# Patient Record
Sex: Male | Born: 1965 | Race: Black or African American | Hispanic: No | Smoking: Current every day smoker
Health system: Southern US, Community
[De-identification: ages and names within clinical notes are randomized; demographics above are authoritative.]

## PROBLEM LIST (undated history)

## (undated) DIAGNOSIS — Z789 Other specified health status: Secondary | ICD-10-CM

## (undated) HISTORY — PX: ROTATOR CUFF REPAIR: SHX139

---

## 2011-03-17 ENCOUNTER — Emergency Department: Payer: Self-pay | Admitting: Emergency Medicine

## 2011-03-19 ENCOUNTER — Emergency Department: Payer: Self-pay | Admitting: Emergency Medicine

## 2015-08-05 ENCOUNTER — Emergency Department
Admission: EM | Admit: 2015-08-05 | Discharge: 2015-08-05 | Disposition: A | Discharge status: Home / Self Care | Payer: Self-pay | Attending: Emergency Medicine | Admitting: Emergency Medicine

## 2015-08-05 DIAGNOSIS — Z720 Tobacco use: Secondary | ICD-10-CM | POA: Insufficient documentation

## 2015-08-05 DIAGNOSIS — K047 Periapical abscess without sinus: Secondary | ICD-10-CM | POA: Insufficient documentation

## 2015-08-05 HISTORY — DX: Other specified health status: Z78.9

## 2015-08-05 MED ORDER — CEFTRIAXONE SODIUM 1 G IJ SOLR
500.0000 mg | Freq: Once | INTRAMUSCULAR | Status: AC
  Administered 2015-08-05: 500 mg via INTRAMUSCULAR
  Filled 2015-08-05: qty 10

## 2015-08-05 MED ORDER — OXYCODONE-ACETAMINOPHEN 5-325 MG PO TABS
2.0000 | ORAL_TABLET | Freq: Once | ORAL | Status: AC
  Administered 2015-08-05: 2 via ORAL
  Filled 2015-08-05: qty 2

## 2015-08-05 MED ORDER — IBUPROFEN 800 MG PO TABS: 800.0000 mg | ORAL_TABLET | Freq: Three times a day (TID) | ORAL | Status: DC | PRN

## 2015-08-05 MED ORDER — AMOXICILLIN 500 MG PO TABS: 500.0000 mg | ORAL_TABLET | Freq: Two times a day (BID) | ORAL | Status: DC

## 2015-08-05 MED ORDER — OXYCODONE-ACETAMINOPHEN 5-325 MG PO TABS: 1.0000 | ORAL_TABLET | ORAL | Status: DC | PRN

## 2015-08-05 NOTE — ED Notes (Signed)
Pt c/o right sided upper mouth swelling starting yesterday and worsening this am. Painful to touch. Pain 10/10.

## 2015-08-05 NOTE — ED Provider Notes (Signed)
[facility] Emergency Department Provider Note  ____________________________________________  Time seen: Approximately 10:21 AM  I have reviewed the triage vital signs and the nursing notes.   HISTORY  Chief Complaint Facial Swelling    HPI Dustin Meyers is a 49 y.o. male resents for evaluation of right upper lip pain and jaw swelling. Sudden onset yesterday with the pain radiating of 10 over 10.   Past Medical History  Diagnosis Date  . Patient denies medical problems     There are no active problems to display for this patient.   History reviewed. No pertinent past surgical history.  Current Outpatient Rx  Name  Route  Sig  Dispense  Refill  . amoxicillin (AMOXIL) 500 MG tablet   Oral   Take 1 tablet (500 mg total) by mouth 2 (two) times daily.   20 tablet   0   . ibuprofen (ADVIL,MOTRIN) 800 MG tablet   Oral   Take 1 tablet (800 mg total) by mouth every 8 (eight) hours as needed.   30 tablet   0   . oxyCODONE-acetaminophen (ROXICET) 5-325 MG tablet   Oral   Take 1-2 tablets by mouth every 4 (four) hours as needed for severe pain.   15 tablet   0     Allergies Review of patient's allergies indicates no known allergies.  History reviewed. No pertinent family history.  Social History Social History  Substance Use Topics  . Smoking status: Current Every Day Smoker  . Smokeless tobacco: None  . Alcohol Use: Yes    Review of Systems Constitutional: No fever/chills Eyes: No visual changes. ENT: Positive right upper lip edema-pain Cardiovascular: Denies chest pain. Respiratory: Denies shortness of breath. Gastrointestinal: No abdominal pain.  No nausea, no vomiting.  No diarrhea.  No constipation. Genitourinary: Negative for dysuria. Musculoskeletal: Negative for back pain. Skin: Negative for rash. Neurological: Negative for headaches, focal weakness or numbness.  10-point ROS otherwise  negative.  ____________________________________________   PHYSICAL EXAM:  VITAL SIGNS: ED Triage Vitals  Enc Vitals Group     BP 08/05/15 0933 131/77 mmHg     Pulse Rate 08/05/15 0933 104     Resp 08/05/15 0933 18     Temp 08/05/15 0933 98.6 F (37 C)     Temp Source 08/05/15 0933 Oral     SpO2 08/05/15 0933 96 %     Weight 08/05/15 0933 190 lb (86.183 kg)     Height 08/05/15 0933  (1.854 m)     Head Cir --      Peak Flow --      Pain Score 08/05/15 0933 10     Pain Loc --      Pain Edu? --      Excl. in GC? --     Constitutional: Alert and oriented. Well appearing and in no acute distress. Eyes: Conjunctivae are normal. PERRL. EOMI. Head: Atraumatic. Nose: No congestion/rhinnorhea. Mouth/Throat: Mucous membranes are moist.  Oropharynx non-erythematous. Teeth with multiple dental caries. Right upper incisor fracture with erythema and tenderness to the gum Neck: No stridor.   Cardiovascular: Normal rate, regular rhythm. Grossly normal heart sounds.  Good peripheral circulation. Respiratory: Normal respiratory effort.  No retractions. Lungs CTAB. Gastrointestinal: Soft and nontender. No distention. No abdominal bruits. No CVA tenderness. Musculoskeletal: No lower extremity tenderness nor edema.  No joint effusions. Neurologic:  Normal speech and language. No gross focal neurologic deficits are appreciated. No gait instability. Skin:  Skin is warm,  dry and intact. No rash noted. Psychiatric: Mood and affect are normal. Speech and behavior are normal.  ____________________________________________   LABS (all labs ordered are listed, but only abnormal results are displayed)  Labs Reviewed - No data to display ____________________________________________   PROCEDURES  Procedure(s) performed: None  Critical Care performed: No  ____________________________________________   INITIAL IMPRESSION / ASSESSMENT AND PLAN / ED COURSE  Pertinent labs & imaging results  that were available during my care of the patient were reviewed by me and considered in my medical decision making (see chart for details).  Dental abscess. Rx given for amoxicillin 500 mg 3 times a day #30, Motrin 800 mg 3 times a day #30, Percocet 5/325 #10. Follow up with dentist, list provided. She voices no other emergency medical complaints at this time. ____________________________________________   FINAL CLINICAL IMPRESSION(S) / ED DIAGNOSES  Final diagnoses:  Dental abscess      Dustin DakinCharles M Roxanne Panek, PA-C 08/05/15 1039  Dustin MoccasinBrian S Quigley, MD 08/05/15 [phone]

## 2015-08-05 NOTE — Discharge Instructions (Signed)
OPTIONS FOR DENTAL FOLLOW UP CARE ° °[place] [facility] [employer] and [facility] Services - Local Safety Net Dental Clinics °http://www.ncdhhs.gov/dph/oralhealth/services/safetynetclinics.htm °  °[facility] (336-562-3123) ° °[facility] [place] (919-933-9087) ° °[facility] (919-663-1744 ext [phone]) ° °[facility] ([phone]) ° °[facility] ([phone]) °This clinic caters to the indigent population and is on a lottery system. °Location: °[facility], [address], [place] °Clinic Hours: °Wednesdays from 6pm - 9pm, patients seen by a lottery system. °For dates, call or go to [email or web address]/[facility]/patients/[email or web address] °Services: °Cleanings, fillings and simple extractions. °Payment Options: °DENTAL WORK IS FREE OF CHARGE. Bring proof of income or support. °Best way to get seen: °Arrive at 5:15 pm - this is a lottery, NOT first come/first serve, so arriving earlier will not increase your chances of being seen. °  °  °[facility] °[phone] °Select option 1 for emergencies °  °Location: °[facility], [address], [place] °Clinic Hours: °No walk-ins accepted - call the day before to schedule an appointment. °Check in times are 9:30 am and 1:30 pm. °Services: °Simple extractions, temporary fillings, pulpectomy/pulp debridement, uncomplicated abscess drainage. °Payment Options: °PAYMENT IS DUE AT THE TIME OF SERVICE.  Fee is usually $100-200, additional surgical procedures (e.g. abscess drainage) may be extra. °Cash, checks, Visa/MasterCard accepted.  Can file Medicaid if patient is covered for dental - patient should call case worker to check. °No discount for [facility] patients. °Best way to get seen: °MUST call the day before and get onto the schedule. Can usually be seen the next 1-2 days. No walk-ins accepted. °  °  °[facility] °[phone] °   °Location: °[facility], [address], [place] °Clinic Hours: °M, W, Th, F 8am or 1:30pm, Tues 9a or 1:30 - first come/first served. °Services: °Simple extractions, temporary fillings, uncomplicated abscess drainage.  You do not need to be an [place] resident. °Payment Options: °PAYMENT IS DUE AT THE TIME OF SERVICE. °Dental insurance, otherwise sliding scale - bring proof of income or support. °Depending on income and treatment needed, cost is usually $50-200. °Best way to get seen: °Arrive early as it is first come/first served. °  °  °[facility] °[phone] °  °Location: °[address] °Clinic Hours: °Mon-Thu 8a-5p °Services: °Most basic dental services including extractions and fillings. °Payment Options: °PAYMENT IS DUE AT THE TIME OF SERVICE. °Sliding scale, up to 50% off - bring proof if income or support. °Medicaid with dental option accepted. °Best way to get seen: °Call to schedule an appointment, can usually be seen within 2 weeks OR they will try to see walk-ins - show up at 8a or 2p (you may have to wait). °  °  °[facility] °[phone] °[place] RESIDENTS ONLY °  °Location: °[facility], [address], [place], [place] [address] °Clinic Hours: By appointment only. °Monday - Thursday 8am-5pm, Friday 8am-12pm °Services: Cleanings, fillings, extractions. °Payment Options: °PAYMENT IS DUE AT THE TIME OF SERVICE. °Cash, Visa or MasterCard. Sliding scale - $30 minimum per service. °Best way to get seen: °Come in to office, complete packet and make an appointment - need proof of income °or support monies for each household member and proof of [place] residence. °Usually takes about a month to get in. °  °  °[facility] °[phone] °  °Location: °[address]   [place] °Clinic Hours: Walk-in Urgent Care Dental Services are offered Monday-Friday  mornings only. °The numbers of emergencies accepted daily is limited to the number of °providers available. °Maximum 15 - Mondays, Wednesdays & Thursdays °Maximum 10 - Tuesdays & Fridays °Services: °You do not need to be a  [place] resident to be seen for a dental emergency. °Emergencies are defined as pain, swelling, abnormal bleeding, or dental trauma. Walkins will receive x-rays if needed. °NOTE: Dental cleaning is not an emergency. °Payment Options: °PAYMENT IS DUE AT THE TIME OF SERVICE. °Minimum co-pay is $40.00 for uninsured patients. °Minimum co-pay is $3.00 for Medicaid with dental coverage. °Dental Insurance is accepted and must be presented at time of visit. °Medicare does not cover dental. °Forms of payment: Cash, credit card, checks. °Best way to get seen: °If not previously registered with the clinic, walk-in dental registration begins at 7:15 am and is on a first come/first serve basis. °If previously registered with the clinic, call to make an appointment. °  °  °[facility] °[phone] °[place] RESIDENTS ONLY °  °Location: °[address], [place], [place] °Clinic Hours: °Mon-Thu 10a-2p °Services: Extractions only! °Payment Options: °FREE (donations accepted) - bring proof of income or support °Best way to get seen: °Call and schedule an appointment OR come at 8am on the 1st Monday of every month (except for holidays) when it is first come/first served. °  °  °[facility] °[phone] °  °Location: °[address], [place] °Clinic Hours: °Friday mornings °Services, Payment Options, Best way to get seen: °Call for info ° ° °Dental Abscess °A dental abscess is a collection of pus in or around a tooth. °CAUSES °This condition is caused by a bacterial infection around the root of the tooth that involves the inner part of the tooth (pulp). It may result from: °· Severe tooth decay. °· Trauma to the tooth that allows bacteria to enter into the pulp, such as a broken or chipped  tooth. °· Severe gum disease around a tooth. °SYMPTOMS °Symptoms of this condition include: °· Severe pain in and around the infected tooth. °· Swelling and redness around the infected tooth, in the mouth, or in the face. °· Tenderness. °· Pus drainage. °· Bad breath. °· Bitter taste in the mouth. °· Difficulty swallowing. °· Difficulty opening the mouth. °· Nausea. °· Vomiting. °· Chills. °· Swollen neck glands. °· Fever. °DIAGNOSIS °This condition is diagnosed with examination of the infected tooth. During the exam, your dentist may tap on the infected tooth. Your dentist will also ask about your medical and dental history and may order X-rays. °TREATMENT °This condition is treated by eliminating the infection. This may be done with: °· Antibiotic medicine. °· A root canal. This may be performed to save the tooth. °· Pulling (extracting) the tooth. This may also involve draining the abscess. This is done if the tooth cannot be saved. °HOME CARE INSTRUCTIONS °· Take medicines only as directed by your dentist. °· If you were prescribed antibiotic medicine, finish all of it even if you start to feel better. °· Rinse your mouth (gargle) often with salt water to relieve pain or swelling. °· Do not drive or operate heavy machinery while taking pain medicine. °· Do not apply heat to the outside of your mouth. °· Keep all follow-up visits as directed by your dentist. This is important. °SEEK MEDICAL CARE IF: °· Your pain is worse and is not helped by medicine. °SEEK IMMEDIATE MEDICAL CARE IF: °·   You have a fever or chills. °· Your symptoms suddenly get worse. °· You have a very bad headache. °· You have problems breathing or swallowing. °· You have trouble opening your mouth. °· You have swelling in your neck or around your eye. °  °This information is not intended to replace advice given to you by your health care provider. Make sure you discuss any questions you have with your health care provider. °  °Document  Released: 09/29/2005 Document Revised: 02/13/2015 Document Reviewed: 09/26/2014 °Elsevier Interactive Patient Education ©2016 Elsevier Inc. ° °

## 2015-10-22 ENCOUNTER — Encounter: Payer: Self-pay | Admitting: Emergency Medicine

## 2015-10-22 ENCOUNTER — Emergency Department
Admission: EM | Admit: 2015-10-22 | Discharge: 2015-10-22 | Disposition: A | Discharge status: Home / Self Care | Payer: No Typology Code available for payment source | Attending: Emergency Medicine | Admitting: Emergency Medicine

## 2015-10-22 ENCOUNTER — Emergency Department: Payer: No Typology Code available for payment source

## 2015-10-22 DIAGNOSIS — S7002XA Contusion of left hip, initial encounter: Secondary | ICD-10-CM | POA: Insufficient documentation

## 2015-10-22 DIAGNOSIS — Z792 Long term (current) use of antibiotics: Secondary | ICD-10-CM | POA: Diagnosis not present

## 2015-10-22 DIAGNOSIS — S4992XA Unspecified injury of left shoulder and upper arm, initial encounter: Secondary | ICD-10-CM | POA: Diagnosis not present

## 2015-10-22 DIAGNOSIS — Y9389 Activity, other specified: Secondary | ICD-10-CM | POA: Diagnosis not present

## 2015-10-22 DIAGNOSIS — M62838 Other muscle spasm: Secondary | ICD-10-CM

## 2015-10-22 DIAGNOSIS — S7012XA Contusion of left thigh, initial encounter: Secondary | ICD-10-CM | POA: Insufficient documentation

## 2015-10-22 DIAGNOSIS — M62830 Muscle spasm of back: Secondary | ICD-10-CM | POA: Insufficient documentation

## 2015-10-22 DIAGNOSIS — S0990XA Unspecified injury of head, initial encounter: Secondary | ICD-10-CM | POA: Insufficient documentation

## 2015-10-22 DIAGNOSIS — Y92410 Unspecified street and highway as the place of occurrence of the external cause: Secondary | ICD-10-CM | POA: Insufficient documentation

## 2015-10-22 DIAGNOSIS — F17200 Nicotine dependence, unspecified, uncomplicated: Secondary | ICD-10-CM | POA: Diagnosis not present

## 2015-10-22 DIAGNOSIS — Y998 Other external cause status: Secondary | ICD-10-CM | POA: Diagnosis not present

## 2015-10-22 DIAGNOSIS — S199XXA Unspecified injury of neck, initial encounter: Secondary | ICD-10-CM | POA: Diagnosis present

## 2015-10-22 MED ORDER — CYCLOBENZAPRINE HCL 10 MG PO TABS: 10.0000 mg | ORAL_TABLET | Freq: Three times a day (TID) | ORAL | Status: DC | PRN

## 2015-10-22 MED ORDER — MELOXICAM 15 MG PO TABS
15.0000 mg | ORAL_TABLET | Freq: Every day | ORAL | Status: DC
Start: 2015-10-22 — End: 2017-09-14
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
## 2015-10-22 NOTE — Discharge Instructions (Signed)
Contusion °A contusion is a deep bruise. Contusions are the result of a blunt injury to tissues and muscle fibers under the skin. The injury causes bleeding under the skin. The skin overlying the contusion may turn blue, purple, or yellow. Minor injuries will give you a painless contusion, but more severe contusions may stay painful and swollen for a few weeks.  °CAUSES  °This condition is usually caused by a blow, trauma, or direct force to an area of the body. °SYMPTOMS  °Symptoms of this condition include: °· Swelling of the injured area. °· Pain and tenderness in the injured area. °· Discoloration. The area may have redness and then turn blue, purple, or yellow. °DIAGNOSIS  °This condition is diagnosed based on a physical exam and medical history. An X-ray, CT scan, or MRI may be needed to determine if there are any associated injuries, such as broken bones (fractures). °TREATMENT  °Specific treatment for this condition depends on what area of the body was injured. In general, the best treatment for a contusion is resting, icing, applying pressure to (compression), and elevating the injured area. This is often called the RICE strategy. Over-the-counter anti-inflammatory medicines may also be recommended for pain control.  °HOME CARE INSTRUCTIONS  °· Rest the injured area. °· If directed, apply ice to the injured area: °¨ Put ice in a plastic bag. °¨ Place a towel between your skin and the bag. °¨ Leave the ice on for 20 minutes, 2-3 times per day. °· If directed, apply light compression to the injured area using an elastic bandage. Make sure the bandage is not wrapped too tightly. Remove and reapply the bandage as directed by your health care provider. °· If possible, raise (elevate) the injured area above the level of your heart while you are sitting or lying down. °· Take over-the-counter and prescription medicines only as told by your health care provider. °SEEK MEDICAL CARE IF: °· Your symptoms do not  improve after several days of treatment. °· Your symptoms get worse. °· You have difficulty moving the injured area. °SEEK IMMEDIATE MEDICAL CARE IF:  °· You have severe pain. °· You have numbness in a hand or foot. °· Your hand or foot turns pale or cold. °  °This information is not intended to replace advice given to you by your health care provider. Make sure you discuss any questions you have with your health care provider. °  °Document Released: 07/09/2005 Document Revised: 06/20/2015 Document Reviewed: 02/14/2015 °Elsevier Interactive Patient Education ©2016 Elsevier Inc. ° °Periosteal Hematoma °Periosteal hematoma (bone bruise) is a localized, tender, raised area close to the bone. It can occur from a small hidden fracture of the bone, following surgery, or from other trauma to the area. It typically occurs in bones located close to the surface of the skin, such as the shin, knee, and heel bone. Although it may take 2 or more weeks to completely heal, bone bruises typically are not associated with permanent or serious damage to the bone. If you are taking blood thinners, you may be at greater risk for such injuries.  °CAUSES  °A bone bruise is usually caused by high-impact trauma to the bone, but it can be caused by sports injuries or twisting injuries. °SIGNS AND SYMPTOMS  °· Severe pain around the injured area that typically lasts longer than a normal bruise. °· Difficulty using the bruised area. °· Tender, raised area close to the bone. °· Discoloration or swelling of the bruised area. °DIAGNOSIS  °You   may need an MRI of the injured area to confirm a bone bruise if your health care provider feels it is necessary. A regular X-ray will not detect a bone bruise, but it will detect a broken bone (fracture). An X-ray may be taken to rule out any fractures. °TREATMENT  °Often, the best treatment for a bone bruise is resting, icing, and applying cold compresses to the injured area. Over-the-counter medicines may  also be recommended for pain control. °HOME CARE INSTRUCTIONS  °Some things you can do to improve the condition are:  °· Rest and elevate the area of injury as long as it is very tender or swollen. °· Apply ice to the injured area: °¨ Put ice in a plastic bag. °¨ Place a towel between your skin and the bag. °¨ Leave the ice on for 20 minutes, 2-3 times a day. °· Use an elastic wrap to reduce swelling and protect the injured area. Make sure it is not applied too tightly. If the area around the wrap becomes cold or blue, the wrap is too tight. Wrap it more loosely. °· For activity: °¨ Follow your health care provider's instructions about whether walking with crutches is required. This will depend on how serious your condition is. °¨ Start weight bearing gradually on the bruised part. °¨ Continue to use crutches or a cane until you can stand without causing pain, or as instructed. °· If a plaster splint was applied: °¨ Wear the splint until you are seen for a follow-up exam. °¨ Rest it on nothing harder than a pillow the first 24 hours. °¨ Do not put weight on it. °¨ Do not get it wet. You may take it off to take a shower or bath. °· You may have been given an elastic bandage to use with or without the plaster splint. The splint is too tight if you have numbness or tingling, or if the skin around the bandage becomes cold and blue. Adjust the bandage to make it comfortable. °· If an air splint was applied: °¨ You may alter the amount of air in the splint as needed for comfort. °¨ You may take it off at night and to take a shower or bath. °· If the injury was in either leg, wiggle your toes in the splint several times per day if you are able. °· Only take over-the-counter or prescription medicines for pain, discomfort, or fever as directed by your health care provider. °· Keep all follow-up visits with your health care provider. This includes any orthopedic referrals, physical therapy, and rehabilitation. Any delay in  getting necessary care could result in a delay or failure of the bones to heal. °SEEK MEDICAL CARE IF:  °· You have an increase in bruising, swelling, tenderness, heat, or pain over your injury. °· You notice coldness of your toes that does not improve after removing a splint or bandage. °· Your pain is not lessened after you take medicine. °· You have increased difficulty bearing weight on the injured leg, if the injury is in either leg. °SEEK IMMEDIATE MEDICAL CARE IF:  °· You have severe pain near the injured area or severe pain with stretching. °· You have increased swelling that resulted in a tense, hard area or a loss of sensation in the area of the injury. °· You have pale, cool skin below the area of the injury (in an extremity) that does not go away after removing a splint or bandage. °MAKE SURE YOU:  °· Understand these   instructions.  Will watch your condition.  Will get help right away if you are not doing well or get worse.   This information is not intended to replace advice given to you by your health care provider. Make sure you discuss any questions you have with your health care provider.   Muscle Cramps and Spasms    Muscle cramps and spasms occur when a muscle or muscles tighten and you have no control over this tightening (involuntary muscle contraction). They are a common problem and can develop in any muscle. The most common place is in the calf muscles of the leg. Both muscle cramps and muscle spasms are involuntary muscle contractions, but they also have differences:  Muscle cramps are sporadic and painful. They may last a few seconds to a quarter of an hour. Muscle cramps are often more forceful and last longer than muscle spasms.  Muscle spasms may or may not be painful. They may also last just a few seconds or much longer. CAUSES  It is uncommon for cramps or spasms to be due to a serious underlying problem. In many cases, the cause of cramps or spasms is unknown. Some  common causes are:  Overexertion.  Overuse from repetitive motions (doing the same thing over and over).  Remaining in a certain position for a long period of time.  Improper preparation, form, or technique while performing a sport or activity.  Dehydration.  Injury.  Side effects of some medicines.  Abnormally low levels of the salts and ions in your blood (electrolytes), especially potassium and calcium. This could happen if you are taking water pills (diuretics) or you are pregnant.  Some underlying medical problems can make it more likely to develop cramps or spasms. These include, but are not limited to:  Diabetes.  Parkinson disease.  Hormone disorders, such as thyroid problems.  Alcohol abuse.  Diseases specific to muscles, joints, and bones.  Blood vessel disease where not enough blood is getting to the muscles.  HOME CARE INSTRUCTIONS  Stay well hydrated. Drink enough water and fluids to keep your urine clear or pale yellow.  It may be helpful to massage, stretch, and relax the affected muscle.  For tight or tense muscles, use a warm towel, heating pad, or hot shower water directed to the affected area.  If you are sore or have pain after a cramp or spasm, applying ice to the affected area may relieve discomfort.  Put ice in a plastic bag.  Place a towel between your skin and the bag.  Leave the ice on for 15-20 minutes, 03-04 times a day. Medicines used to treat a known cause of cramps or spasms may help reduce their frequency or severity. Only take over-the-counter or prescription medicines as directed by your caregiver. SEEK MEDICAL CARE IF:  Your cramps or spasms get more severe, more frequent, or do not improve over time.  MAKE SURE YOU:  Understand these instructions.  Will watch your condition.  Will get help right away if you are not doing well or get worse. This information is not intended to replace advice given to you by your health care provider. Make sure you  discuss any questions you have with your health care provider.  Document Released: 03/21/2002 Document Revised: 01/24/2013 Document Reviewed: 09/15/2012  Elsevier Interactive Patient Education 2016 Elsevier Inc.     Document Released: 11/06/2004 Document Revised: 07/20/2013 Document Reviewed: 03/18/2013 Elsevier Interactive Patient Education [employer] Elsevier Inc.
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
## 2016-01-09 ENCOUNTER — Encounter: Payer: Self-pay | Admitting: Emergency Medicine

## 2016-01-09 ENCOUNTER — Emergency Department
Admission: EM | Admit: 2016-01-09 | Discharge: 2016-01-09 | Disposition: A | Discharge status: Home / Self Care | Payer: No Typology Code available for payment source | Attending: Emergency Medicine | Admitting: Emergency Medicine

## 2016-01-09 DIAGNOSIS — Z792 Long term (current) use of antibiotics: Secondary | ICD-10-CM | POA: Insufficient documentation

## 2016-01-09 DIAGNOSIS — L0231 Cutaneous abscess of buttock: Secondary | ICD-10-CM | POA: Insufficient documentation

## 2016-01-09 DIAGNOSIS — Z791 Long term (current) use of non-steroidal anti-inflammatories (NSAID): Secondary | ICD-10-CM | POA: Insufficient documentation

## 2016-01-09 DIAGNOSIS — L03317 Cellulitis of buttock: Secondary | ICD-10-CM

## 2016-01-09 DIAGNOSIS — F17210 Nicotine dependence, cigarettes, uncomplicated: Secondary | ICD-10-CM | POA: Insufficient documentation

## 2016-01-09 MED ORDER — LIDOCAINE-EPINEPHRINE (PF) 1 %-1:200000 IJ SOLN: 30.0000 mL | Freq: Once | INTRAMUSCULAR | Status: DC

## 2016-01-09 MED ORDER — SULFAMETHOXAZOLE-TRIMETHOPRIM 800-160 MG PO TABS: 1.0000 | ORAL_TABLET | Freq: Two times a day (BID) | ORAL | Status: DC

## 2016-01-09 MED ORDER — LIDOCAINE-EPINEPHRINE (PF) 1 %-1:200000 IJ SOLN
INTRAMUSCULAR | Status: AC
  Filled 2016-01-09: qty 30

## 2016-01-09 MED ORDER — SULFAMETHOXAZOLE-TRIMETHOPRIM 800-160 MG PO TABS
1.0000 | ORAL_TABLET | Freq: Once | ORAL | Status: AC
  Administered 2016-01-09: 1 via ORAL

## 2016-01-09 MED ORDER — SULFAMETHOXAZOLE-TRIMETHOPRIM 800-160 MG PO TABS
ORAL_TABLET | ORAL | Status: AC
  Filled 2016-01-09: qty 1

## 2016-01-09 MED ORDER — TRAMADOL HCL 50 MG PO TABS: 50.0000 mg | ORAL_TABLET | Freq: Two times a day (BID) | ORAL | Status: DC

## 2016-01-09 NOTE — ED Notes (Signed)
Pt presents to ED with abscess located on his right buttock. Affected area is said to have a black center with no drainage present. Noticed a small painful bump on sunday and pt states the area has quickly grown the past few days. Hx of the same.

## 2016-01-09 NOTE — Discharge Instructions (Signed)
Cellulitis Cellulitis is an infection of the skin and the tissue beneath it. The infected area is usually red and tender. Cellulitis occurs most often in the arms and lower legs.  CAUSES  Cellulitis is caused by bacteria that enter the skin through cracks or cuts in the skin. The most common types of bacteria that cause cellulitis are staphylococci and streptococci. SIGNS AND SYMPTOMS   Redness and warmth.  Swelling.  Tenderness or pain.  Fever. DIAGNOSIS  Your health care provider can usually determine what is wrong based on a physical exam. Blood tests may also be done. TREATMENT  Treatment usually involves taking an antibiotic medicine. HOME CARE INSTRUCTIONS   Take your antibiotic medicine as directed by your health care provider. Finish the antibiotic even if you start to feel better.  Keep the infected arm or leg elevated to reduce swelling.  Apply a warm cloth to the affected area up to 4 times per day to relieve pain.  Take medicines only as directed by your health care provider.  Keep all follow-up visits as directed by your health care provider. SEEK MEDICAL CARE IF:   You notice red streaks coming from the infected area.  Your red area gets larger or turns dark in color.  Your bone or joint underneath the infected area becomes painful after the skin has healed.  Your infection returns in the same area or another area.  You notice a swollen bump in the infected area.  You develop new symptoms.  You have a fever. SEEK IMMEDIATE MEDICAL CARE IF:   You feel very sleepy.  You develop vomiting or diarrhea.  You have a general ill feeling (malaise) with muscle aches and pains.   This information is not intended to replace advice given to you by your health care provider. Make sure you discuss any questions you have with your health care provider.   Document Released: 07/09/2005 Document Revised: 06/20/2015 Document Reviewed: 12/15/2011 Elsevier Interactive  Patient Education 2016 Else[employer] Inc[employer]  Abscess An abscess (boil or furuncle) is an infected area on or under the skin. This area is filled with yellowish-white fluid (pus) and other material (debris). HOME CARE   Only take medicines as told by your doctor.  If you were given antibiotic medicine, take it as directed. Finish the medicine even if you start to feel better.  If gauze is used, follow your doctor's directions for changing the gauze.  To avoid spreading the infection:  Keep your abscess covered with a bandage.  Wash your hands well.  Do not share personal care items, towels, or whirlpools with others.  Avoid skin contact with others.  Keep your skin and clothes clean around the abscess.  Keep all doctor visits as told. GET HELP RIGHT AWAY IF:   You have more pain, puffiness (swelling), or redness in the wound site.  You have more fluid or blood coming from the wound site.  You have muscle aches, chills, or you feel sick.  You have a fever. MAKE SURE YOU:   Understand these instructions.  Will watch your condition.  Will get help right away if you are not doing well or get worse.   This information is not intended to replace advice given to you by your health care provider. Make sure you discuss any questions you have with your health care provider.   Document Released: 03/17/2008 Document Revised: 03/30/2012 Document Reviewed: 12/13/2011 Elsevier Interactive Patient Education 2016 Elsevier Inc.  Incision and Drainage Incision and drainage  is a procedure in which a sac-like structure (cystic structure) is opened and drained. The area to be drained usually contains material such as pus, fluid, or blood.  LET YOUR CAREGIVER KNOW ABOUT:   Allergies to medicine.  Medicines taken, including vitamins, herbs, eyedrops, over-the-counter medicines, and creams.  Use of steroids (by mouth or creams).  Previous problems with anesthetics or numbing  medicines.  History of bleeding problems or blood clots.  Previous surgery.  Other health problems, including diabetes and kidney problems.  Possibility of pregnancy, if this applies. RISKS AND COMPLICATIONS  Pain.  Bleeding.  Scarring.  Infection. BEFORE THE PROCEDURE  You may need to have an ultrasound or other imaging tests to see how large or deep your cystic structure is. Blood tests may also be used to determine if you have an infection or how severe the infection is. You may need to have a tetanus shot. PROCEDURE  The affected area is cleaned with a cleaning fluid. The cyst area will then be numbed with a medicine (local anesthetic). A small incision will be made in the cystic structure. A syringe or catheter may be used to drain the contents of the cystic structure, or the contents may be squeezed out. The area will then be flushed with a cleansing solution. After cleansing the area, it is often gently packed with a gauze or another wound dressing. Once it is packed, it will be covered with gauze and tape or some other type of wound dressing. AFTER THE PROCEDURE   Often, you will be allowed to go home right after the procedure.  You may be given antibiotic medicine to prevent or heal an infection.  If the area was packed with gauze or some other wound dressing, you will likely need to come back in 1 to 2 days to get it removed.  The area should heal in about 14 days.   This information is not intended to replace advice given to you by your health care provider. Make sure you discuss any questions you have with your health care provider.   Document Released: 03/25/2001 Document Revised: 03/30/2012 Document Reviewed: 11/24/2011 Elsevier Interactive Patient Education [employer] Else[employer] Inc.  Keep the wound clean, dry, and covered. Apply warm compresses through the bandage. Remove the packing in 2-3 days as discussed. Follow-up with one of the local community clinics for wound  check as needed. Return to the ED for worsening symptoms. Take the antibiotic until completed.

## 2016-01-09 NOTE — ED Provider Notes (Signed)
[facility] Emergency Department Provider Note ____________________________________________  Time seen: 0716  I have reviewed the triage vital signs and the nursing notes.  HISTORY  Chief Complaint  Abscess  HPI Dustin Meyers is a 50 y.o. male presents to the ED for evaluation management of an abscess to the right buttocks. Patient notes area has not drained since its onset on Sunday, 3 days prior. The area has increased in size since onset. He has a history of prior abscesses treated by incision and drainage. He denies any intermittent fevers, chills, sweats.He rates his discomfort as a 10/10 in triage.  Past Medical History  Diagnosis Date  . Patient denies medical problems     There are no active problems to display for this patient.   History reviewed. No pertinent past surgical history.  Current Outpatient Rx  Name  Route  Sig  Dispense  Refill  . amoxicillin (AMOXIL) 500 MG tablet   Oral   Take 1 tablet (500 mg total) by mouth 2 (two) times daily.   20 tablet   0   . cyclobenzaprine (FLEXERIL) 10 MG tablet   Oral   Take 1 tablet (10 mg total) by mouth 3 (three) times daily as needed for muscle spasms.   15 tablet   0   . ibuprofen (ADVIL,MOTRIN) 800 MG tablet   Oral   Take 1 tablet (800 mg total) by mouth every 8 (eight) hours as needed.   30 tablet   0   . meloxicam (MOBIC) 15 MG tablet   Oral   Take 1 tablet (15 mg total) by mouth daily.   30 tablet   0   . oxyCODONE-acetaminophen (ROXICET) 5-325 MG tablet   Oral   Take 1-2 tablets by mouth every 4 (four) hours as needed for severe pain.   15 tablet   0   . sulfamethoxazole-trimethoprim (BACTRIM DS,SEPTRA DS) 800-160 MG tablet   Oral   Take 1 tablet by mouth 2 (two) times daily.   20 tablet   0   . traMADol (ULTRAM) 50 MG tablet   Oral   Take 1 tablet (50 mg total) by mouth 2 (two) times daily.   10 tablet   0    Allergies Review of patient's allergies indicates no  known allergies.  No family history on file.  Social History Social History  Substance Use Topics  . Smoking status: Current Every Day Smoker    Types: Cigarettes  . Smokeless tobacco: Never Used  . Alcohol Use: Yes   Review of Systems  Constitutional: Negative for fever. Cardiovascular: Negative for chest pain. Respiratory: Negative for shortness of breath. Gastrointestinal: Negative for abdominal pain, vomiting and diarrhea. Genitourinary: Negative for dysuria. Musculoskeletal: Negative for back pain. Skin: Negative for rash. Right buttock abscess as above. Neurological: Negative for headaches, focal weakness or numbness. ____________________________________________  PHYSICAL EXAM:  VITAL SIGNS: ED Triage Vitals  Enc Vitals Group     BP 01/09/16 0631 123/70 mmHg     Pulse Rate 01/09/16 0631 93     Resp 01/09/16 0631 20     Temp 01/09/16 0631 98.1 F (36.7 C)     Temp Source 01/09/16 0631 Oral     SpO2 01/09/16 0631 97 %     Weight 01/09/16 0631 175 lb (79.379 kg)     Height 01/09/16 0631  (1.854 m)     Head Cir --      Peak Flow --  Pain Score 01/09/16 0631 10     Pain Loc --      Pain Edu? --      Excl. in GC? --    Constitutional: Alert and oriented. Well appearing and in no distress. Head: Normocephalic and atraumatic. Eyes: Conjunctivae are normal. PERRL. Normal extraocular movements  Hematological/Lymphatic/Immunological: No cervical lymphadenopathy.Right inguinal lymphadenopathy and phlebitis noted. Cardiovascular: Normal rate, regular rhythm.  Respiratory: Normal respiratory effort. No wheezes/rales/rhonchi. Gastrointestinal: Soft and nontender. No distention. Musculoskeletal: Nontender with normal range of motion in all extremities.  Neurologic:  Normal gait without ataxia. Normal speech and language. No gross focal neurologic deficits are appreciated. Skin:  Skin is warm, dry and intact. No rash noted. Right buttocks abscess noted inferiorly.  Induration measuring about 5 cm in diameter noted with central scab. Psychiatric: Mood and affect are normal. Patient exhibits appropriate insight and judgment. ____________________________________________  PROCEDURES Bactrim DS 1 PO  INCISION AND DRAINAGE Performed by: Lissa HoardMenshew, Cyndie Woodbeck V Bacon Consent: Verbal consent obtained. Risks and benefits: risks, benefits and alternatives were discussed Type: abscess  Body area: right buttock  Anesthesia: local infiltration  Incision was made with a scalpel.  Local anesthetic: lidocaine 1% w/ epinephrine  Anesthetic total: 5 ml  Complexity: complex Blunt dissection to break up loculations  Drainage: purulent  Drainage amount: small  Packing material: 1/4 in iodoform gauze  Patient tolerance: Patient tolerated the procedure well with no immediate complications. ____________________________________________  INITIAL IMPRESSION / ASSESSMENT AND PLAN / ED COURSE  Patient with a right buttock cellulitis with small local abscess, status post incision and drainage. Wound is packed and dressed as appropriate. The patient is discharged with a prescription for Bactrim DS and Ultram to dose as directed for infection pain. He is to follow-up with one of local clinics for wound check as necessary. Return to the ED for any worsening symptoms as discussed. ____________________________________________  FINAL CLINICAL IMPRESSION(S) / ED DIAGNOSES  Final diagnoses:  Cellulitis of right buttock  Abscess of buttock, right     Lissa HoardJenise V Bacon Eriyana Sweeten, PA-C 01/09/16 [id number]  Charlesetta IvoryJenise V Bacon [place], PA-C 01/09/16 [phone]  Jeanmarie PlantJames A McShane, MD 01/09/16 1534

## 2016-01-09 NOTE — ED Notes (Signed)
States he noticed an abscess area to right buttocks couple of days ago  Area is larger and pain it spreading into groin area

## 2016-01-11 ENCOUNTER — Emergency Department
Admission: EM | Admit: 2016-01-11 | Discharge: 2016-01-11 | Disposition: A | Discharge status: Home / Self Care | Payer: No Typology Code available for payment source | Attending: Emergency Medicine | Admitting: Emergency Medicine

## 2016-01-11 ENCOUNTER — Encounter: Payer: Self-pay | Admitting: Emergency Medicine

## 2016-01-11 DIAGNOSIS — Z789 Other specified health status: Secondary | ICD-10-CM | POA: Insufficient documentation

## 2016-01-11 DIAGNOSIS — A084 Viral intestinal infection, unspecified: Secondary | ICD-10-CM | POA: Insufficient documentation

## 2016-01-11 DIAGNOSIS — Z791 Long term (current) use of non-steroidal anti-inflammatories (NSAID): Secondary | ICD-10-CM | POA: Insufficient documentation

## 2016-01-11 DIAGNOSIS — Z79899 Other long term (current) drug therapy: Secondary | ICD-10-CM | POA: Insufficient documentation

## 2016-01-11 DIAGNOSIS — K2970 Gastritis, unspecified, without bleeding: Secondary | ICD-10-CM

## 2016-01-11 DIAGNOSIS — Z792 Long term (current) use of antibiotics: Secondary | ICD-10-CM | POA: Insufficient documentation

## 2016-01-11 DIAGNOSIS — F17210 Nicotine dependence, cigarettes, uncomplicated: Secondary | ICD-10-CM | POA: Insufficient documentation

## 2016-01-11 LAB — URINALYSIS COMPLETE WITH MICROSCOPIC (ARMC ONLY)
BACTERIA UA: NONE SEEN
Bilirubin Urine: NEGATIVE
Glucose, UA: NEGATIVE mg/dL
LEUKOCYTES UA: NEGATIVE
NITRITE: NEGATIVE
PH: 5 (ref 5.0–8.0)
SPECIFIC GRAVITY, URINE: 1.033 — AB (ref 1.005–1.030)
Squamous Epithelial / LPF: NONE SEEN

## 2016-01-11 LAB — CBC
HEMATOCRIT: 49.4 % (ref 40.0–52.0)
HEMOGLOBIN: 17 g/dL (ref 13.0–18.0)
MCH: 33.2 pg (ref 26.0–34.0)
MCHC: 34.4 g/dL (ref 32.0–36.0)
MCV: 96.4 fL (ref 80.0–100.0)
Platelets: 182 10*3/uL (ref 150–440)
RBC: 5.12 MIL/uL (ref 4.40–5.90)
RDW: 13.6 % (ref 11.5–14.5)
WBC: 8.4 10*3/uL (ref 3.8–10.6)

## 2016-01-11 LAB — COMPREHENSIVE METABOLIC PANEL
ALBUMIN: 4.5 g/dL (ref 3.5–5.0)
ALT: 33 U/L (ref 17–63)
ANION GAP: 11 (ref 5–15)
AST: 40 U/L (ref 15–41)
Alkaline Phosphatase: 54 U/L (ref 38–126)
BUN: 12 mg/dL (ref 6–20)
CHLORIDE: 98 mmol/L — AB (ref 101–111)
CO2: 24 mmol/L (ref 22–32)
Calcium: 9.5 mg/dL (ref 8.9–10.3)
Creatinine, Ser: 1.28 mg/dL — ABNORMAL HIGH (ref 0.61–1.24)
GFR calc Af Amer: >60 mL/min (ref >60)
GFR calc non Af Amer: >60 mL/min (ref >60)
GLUCOSE: 117 mg/dL — AB (ref 65–99)
POTASSIUM: 3.4 mmol/L — AB (ref 3.5–5.1)
SODIUM: 133 mmol/L — AB (ref 135–145)
TOTAL PROTEIN: 8.5 g/dL — AB (ref 6.5–8.1)
Total Bilirubin: 0.7 mg/dL (ref 0.3–1.2)

## 2016-01-11 LAB — LIPASE, BLOOD: Lipase: 14 U/L (ref 11–51)

## 2016-01-11 MED ORDER — SODIUM CHLORIDE 0.9 % IV BOLUS (SEPSIS)
1000.0000 mL | Freq: Once | INTRAVENOUS | Status: AC
  Administered 2016-01-11: 1000 mL via INTRAVENOUS

## 2016-01-11 MED ORDER — ONDANSETRON HCL 4 MG/2ML IJ SOLN
4.0000 mg | Freq: Once | INTRAMUSCULAR | Status: AC
  Administered 2016-01-11: 4 mg via INTRAVENOUS

## 2016-01-11 MED ORDER — ONDANSETRON HCL 4 MG PO TABS: 4.0000 mg | ORAL_TABLET | Freq: Every day | ORAL | Status: DC | PRN

## 2016-01-11 MED ORDER — ONDANSETRON HCL 4 MG/2ML IJ SOLN
INTRAMUSCULAR | Status: AC
  Administered 2016-01-11: 4 mg via INTRAVENOUS
  Filled 2016-01-11: qty 2

## 2016-01-11 NOTE — ED Notes (Signed)
MD in room to assess patient at this time.  Will continue to monitor.   

## 2016-01-11 NOTE — Discharge Instructions (Signed)

## 2016-01-11 NOTE — ED Notes (Signed)
Pt verbalized understanding of discharge instructions. NAD at this time. 

## 2016-01-11 NOTE — ED Provider Notes (Signed)
[facility] Emergency Department Provider Note  ____________________________________________    I have reviewed the triage vital signs and the nursing notes.   HISTORY  Chief Complaint Emesis    HPI Dustin Meyers is a 50 y.o. male who presents with nausea and vomiting for approximately 2 days. He denies abdominal pain. He does report chills but no fevers. No recent travel. He has not had diarrhea but reports he has nothing in his stomach because he has not been eating. He was recently seen for I&D of abscess on his buttocks which he reports is significantly improved.He feels he is dehydrated because his urine is dark     Past Medical History  Diagnosis Date  . Patient denies medical problems     There are no active problems to display for this patient.   History reviewed. No pertinent past surgical history.  Current Outpatient Rx  Name  Route  Sig  Dispense  Refill  . amoxicillin (AMOXIL) 500 MG tablet   Oral   Take 1 tablet (500 mg total) by mouth 2 (two) times daily.   20 tablet   0   . cyclobenzaprine (FLEXERIL) 10 MG tablet   Oral   Take 1 tablet (10 mg total) by mouth 3 (three) times daily as needed for muscle spasms.   15 tablet   0   . ibuprofen (ADVIL,MOTRIN) 800 MG tablet   Oral   Take 1 tablet (800 mg total) by mouth every 8 (eight) hours as needed.   30 tablet   0   . meloxicam (MOBIC) 15 MG tablet   Oral   Take 1 tablet (15 mg total) by mouth daily.   30 tablet   0   . oxyCODONE-acetaminophen (ROXICET) 5-325 MG tablet   Oral   Take 1-2 tablets by mouth every 4 (four) hours as needed for severe pain.   15 tablet   0   . sulfamethoxazole-trimethoprim (BACTRIM DS,SEPTRA DS) 800-160 MG tablet   Oral   Take 1 tablet by mouth 2 (two) times daily.   20 tablet   0   . traMADol (ULTRAM) 50 MG tablet   Oral   Take 1 tablet (50 mg total) by mouth 2 (two) times daily.   10 tablet   0     Allergies Review of  patient's allergies indicates no known allergies.  History reviewed. No pertinent family history.  Social History Social History  Substance Use Topics  . Smoking status: Current Every Day Smoker    Types: Cigarettes  . Smokeless tobacco: Never Used  . Alcohol Use: Yes    Review of Systems  Constitutional: Negative for fever. Eyes: Negative for redness ENT: Negative for sore throat Cardiovascular: Negative for chest pain Respiratory: Negative for shortness of breath. Gastrointestinal: Negative for abdominal pain, positive for nausea and vomiting Genitourinary: Negative for dysuria. Dark urine  Musculoskeletal: Negative for back pain. Skin: Negative for rash. Neurological: Negative for focal weakness Psychiatric: no anxiety    ____________________________________________   PHYSICAL EXAM:  VITAL SIGNS: ED Triage Vitals  Enc Vitals Group     BP 01/11/16 1251 125/74 mmHg     Pulse Rate 01/11/16 1251 95     Resp 01/11/16 1251 20     Temp 01/11/16 1251 98.1 F (36.7 C)     Temp Source 01/11/16 1251 Oral     SpO2 01/11/16 1251 100 %     Weight 01/11/16 1251 170 lb (77.111 kg)  Height 01/11/16 1251 6\' 1"  (1.854 m)     Head Cir --      Peak Flow --      Pain Score 01/11/16 1300 10     Pain Loc --      Pain Edu? --      Excl. in GC? --      Constitutional: Alert and oriented. Well appearing and in no distress.  Eyes: Conjunctivae are normal. No erythema or injection ENT   Head: Normocephalic and atraumatic.   Mouth/Throat: Mucous membranes are moist. Cardiovascular: Normal rate, regular rhythm.  Respiratory: Normal respiratory effort without tachypnea nor retractions. Gastrointestinal: Soft and non-tender in all quadrants. No distention. There is no CVA tenderness. Genitourinary: deferred Musculoskeletal: Nontender with normal range of motion in all extremities. No lower extremity tenderness nor edema. Neurologic:  Normal speech and language. No gross  focal neurologic deficits are appreciated. Skin:  Skin is warm, dry and intact. No rash noted. Psychiatric: Mood and affect are normal. Patient exhibits appropriate insight and judgment.  ____________________________________________    LABS (pertinent positives/negatives)  Labs Reviewed  COMPREHENSIVE METABOLIC PANEL - Abnormal; Notable for the following:    Sodium 133 (*)    Potassium 3.4 (*)    Chloride 98 (*)    Glucose, Bld 117 (*)    Creatinine, Ser 1.28 (*)    Total Protein 8.5 (*)    All other components within normal limits  URINALYSIS COMPLETEWITH MICROSCOPIC ([facility] ONLY) - Abnormal; Notable for the following:    Color, Urine AMBER (*)    APPearance CLEAR (*)    Ketones, ur 1+ (*)    Specific Gravity, Urine 1.033 (*)    Hgb urine dipstick 3+ (*)    Protein, ur >500 (*)    All other components within normal limits  LIPASE, BLOOD  CBC    ____________________________________________   EKG None  ____________________________________________    RADIOLOGY  None  ____________________________________________   PROCEDURES  Procedure(s) performed: none  Critical Care performed: none  ____________________________________________   INITIAL IMPRESSION / ASSESSMENT AND PLAN / ED COURSE  Pertinent labs & imaging results that were available during my care of the patient were reviewed by me and considered in my medical decision making (see chart for details).  Patient well-appearing and in no distress. History of present illness an exam most consistent with viral gastritis. We will treat with IV fluids and Zofran. Very mild hyponatremia and mild elevation in creatinine likely improve with fluids. Positive ketones in urine and some red blood cells but no flank pain or dysuria  Patient feels significant better after IV fluids, appropriate for discharge at this time ____________________________________________   FINAL CLINICAL IMPRESSION(S) / ED  DIAGNOSES  Final diagnoses:  Viral gastritis          Dustin Everyobert Zakhai Meisinger, MD 01/11/16 2240

## 2016-01-11 NOTE — ED Notes (Signed)
Pt to ed with c/o vomiting and nausea x 2 days.  Pt states was seen here on 3/29 for abscess and infection, started on antibiotic and then started with vomiting.  Pt states he has not ate any food since wed and only drank 1 gatorade since wed.  Pt reports urine is dark yellow.  States last bm was on Wednesday.

## 2017-09-02 ENCOUNTER — Other Ambulatory Visit: Payer: Self-pay

## 2017-09-02 ENCOUNTER — Encounter: Payer: Self-pay | Admitting: *Deleted

## 2017-09-02 ENCOUNTER — Emergency Department
Admission: EM | Admit: 2017-09-02 | Discharge: 2017-09-02 | Disposition: A | Discharge status: Home / Self Care | Payer: Worker's Compensation | Attending: Emergency Medicine | Admitting: Emergency Medicine

## 2017-09-02 DIAGNOSIS — Y939 Activity, unspecified: Secondary | ICD-10-CM | POA: Diagnosis not present

## 2017-09-02 DIAGNOSIS — Y990 Civilian activity done for income or pay: Secondary | ICD-10-CM | POA: Diagnosis not present

## 2017-09-02 DIAGNOSIS — F17210 Nicotine dependence, cigarettes, uncomplicated: Secondary | ICD-10-CM | POA: Diagnosis not present

## 2017-09-02 DIAGNOSIS — W260XXA Contact with knife, initial encounter: Secondary | ICD-10-CM | POA: Insufficient documentation

## 2017-09-02 DIAGNOSIS — S71111A Laceration without foreign body, right thigh, initial encounter: Secondary | ICD-10-CM | POA: Insufficient documentation

## 2017-09-02 DIAGNOSIS — Y929 Unspecified place or not applicable: Secondary | ICD-10-CM | POA: Insufficient documentation

## 2017-09-02 DIAGNOSIS — S81811A Laceration without foreign body, right lower leg, initial encounter: Secondary | ICD-10-CM

## 2017-09-02 MED ORDER — OXYCODONE-ACETAMINOPHEN 5-325 MG PO TABS: 1.0000 | ORAL_TABLET | Freq: Four times a day (QID) | ORAL | 0 refills | Status: DC | PRN

## 2017-09-02 MED ORDER — LIDOCAINE HCL (PF) 1 % IJ SOLN
INTRAMUSCULAR | Status: AC
  Filled 2017-09-02: qty 15

## 2017-09-02 NOTE — Discharge Instructions (Signed)
Do not get the sutured area wet for 24 hours. After 24 hours, shower/bathe as usual and pat the area dry. Change the bandage 2 times per day and apply antibiotic ointment. Leave open to air when at no risk of getting the area dirty, but cover at night before bed. See PCP or urgent care in 12 days for suture removal or sooner for signs or concern of infection.

## 2017-09-02 NOTE — ED Provider Notes (Signed)
[facility] Emergency Department Provider Note  ____________________________________________   First MD Initiated Contact with Patient 09/02/17 1123     (approximate)  I have reviewed the triage vital signs and the nursing notes.   HISTORY  Chief Complaint Laceration  HPI Lancaster Cellarlton F Garis is a 51 y.o. male who presents to the emergency department for evaluation after cutting his right thigh with a utility knife while at work today.  He bandaged the area immediately and applied pressure.  Bleeding is controlled at this time.  He reports that his tetanus vaccination is up-to-date.  He has no known drug allergies, no chronic health issues and takes no daily medications.  Past Medical History:  Diagnosis Date  . Patient denies medical problems     There are no active problems to display for this patient.   History reviewed. No pertinent surgical history.  Prior to Admission medications   Medication Sig Start Date End Date Taking? Authorizing Provider  amoxicillin (AMOXIL) 500 MG tablet Take 1 tablet (500 mg total) by mouth 2 (two) times daily. 08/05/15   Beers, Charmayne Sheerharles M, PA-C  cyclobenzaprine (FLEXERIL) 10 MG tablet Take 1 tablet (10 mg total) by mouth 3 (three) times daily as needed for muscle spasms. 10/22/15   Cuthriell, Delorise RoyalsJonathan D, PA-C  ibuprofen (ADVIL,MOTRIN) 800 MG tablet Take 1 tablet (800 mg total) by mouth every 8 (eight) hours as needed. 08/05/15   Beers, Charmayne Sheerharles M, PA-C  meloxicam (MOBIC) 15 MG tablet Take 1 tablet (15 mg total) by mouth daily. 10/22/15   Cuthriell, Delorise RoyalsJonathan D, PA-C  ondansetron (ZOFRAN) 4 MG tablet Take 1 tablet (4 mg total) by mouth daily as needed for nausea or vomiting. 01/11/16   Jene EveryKinner, Robert, MD  oxyCODONE-acetaminophen (ROXICET) 5-325 MG tablet Take 1 tablet by mouth every 6 (six) hours as needed for severe pain. 09/02/17   Keedan Sample, Rulon Eisenmengerari B, FNP  sulfamethoxazole-trimethoprim (BACTRIM DS,SEPTRA DS) 800-160 MG tablet Take 1  tablet by mouth 2 (two) times daily. 01/09/16   Menshew, Charlesetta IvoryJenise V Bacon, PA-C  traMADol (ULTRAM) 50 MG tablet Take 1 tablet (50 mg total) by mouth 2 (two) times daily. 01/09/16   Menshew, Charlesetta IvoryJenise V Bacon, PA-C    Allergies Patient has no known allergies.  No family history on file.  Social History Social History   Tobacco Use  . Smoking status: Current Every Day Smoker    Packs/day: 0.50    Types: Cigarettes  . Smokeless tobacco: Never Used  Substance Use Topics  . Alcohol use: Yes  . Drug use: No    Review of Systems  Constitutional: No fever/chills Cardiovascular: Denies chest pain. Respiratory: Denies shortness of breath. Gastrointestinal: No abdominal pain.  No nausea, no vomiting.  Musculoskeletal: Positive for right thigh pain. Skin: Positive for laceration. Neurological: Negative for headaches, focal weakness or numbness. ____________________________________________   PHYSICAL EXAM:  VITAL SIGNS: ED Triage Vitals [09/02/17 1045]  Enc Vitals Group     BP 116/75     Pulse Rate 84     Resp 20     Temp 98.2 F (36.8 C)     Temp Source Oral     SpO2 98 %     Weight 180 lb (81.6 kg)     Height 6\' 1"  (1.854 m)     Head Circumference      Peak Flow      Pain Score 8     Pain Loc      Pain Edu?  Excl. in GC?     Constitutional: Alert and oriented. Well appearing and in no acute distress. Eyes: Conjunctivae are normal.  Head: Atraumatic. Mouth/Throat: Mucous membranes are moist.   Neck: No stridor.   Cardiovascular: Good peripheral circulation. Respiratory: Normal respiratory effort.  No retractions. Musculoskeletal: Full ROM observed, specifically of the right lower extremity.  Neurologic:  Normal speech and language. No gross focal neurologic deficits are appreciated. No gait instability. Skin:  6cm laceration to the anterior right thigh. Psychiatric: Mood and affect are normal. Speech and behavior are  normal.  ____________________________________________   LABS (all labs ordered are listed, but only abnormal results are displayed)  Labs Reviewed - No data to display ____________________________________________  EKG   ____________________________________________  RADIOLOGY  No results found.  ____________________________________________   PROCEDURES  Procedure(s) performed: yes, see procedure note(s).  Marland Kitchen..Laceration Repair Date/Time: 09/02/2017 2:19 PM Performed by: Chinita Pesterriplett, Caraline Deutschman B, FNP Authorized by: Chinita Pesterriplett, Jemar Paulsen B, FNP   Consent:    Consent obtained:  Verbal   Consent given by:  Patient   Risks discussed:  Infection, pain, retained foreign body, poor cosmetic result and poor wound healing Anesthesia (see MAR for exact dosages):    Anesthesia method:  Local infiltration   Local anesthetic:  Lidocaine 1% w/o epi Laceration details:    Location:  Leg   Leg location:  R upper leg   Length (cm):  6 Repair type:    Repair type:  Intermediate Pre-procedure details:    Preparation:  Patient was prepped and draped in usual sterile fashion Exploration:    Hemostasis achieved with:  Direct pressure   Wound exploration: entire depth of wound probed and visualized     Contaminated: no   Treatment:    Area cleansed with:  Saline   Amount of cleaning:  Extensive   Irrigation solution:  Sterile saline   Irrigation method:  Syringe   Visualized foreign bodies/material removed: no   Skin repair:    Repair method:  Sutures   Suture size:  4-0   Suture material:  Nylon   Suture technique:  Horizontal mattress Approximation:    Approximation:  Close   Vermilion border: well-aligned   Post-procedure details:    Dressing:  Sterile dressing   Patient tolerance of procedure:  Tolerated well, no immediate complications    Critical Care performed: No  ____________________________________________   INITIAL IMPRESSION / ASSESSMENT AND PLAN / ED COURSE  As part of  my medical decision making, I reviewed the following data within the electronic MEDICAL RECORD NUMBER   51 year old male presenting to the ER after sustaining a laceration to his right anterior thigh. Sutures placed. Patient given wound care instructions and advised to have the sutures removed in 12 days. He was advised to be seen sooner for any symptom of concern.      ____________________________________________   FINAL CLINICAL IMPRESSION(S) / ED DIAGNOSES  Final diagnoses:  Laceration of right lower extremity, initial encounter     ED Discharge Orders        Ordered    oxyCODONE-acetaminophen (ROXICET) 5-325 MG tablet  Every 6 hours PRN     09/02/17 1235       Note:  This document was prepared using Dragon voice recognition software and may include unintentional dictation errors.    Chinita Pesterriplett, Tedric Leeth B, FNP 09/02/17 1424    Nita SickleVeronese, [place], MD 09/02/17 1534

## 2017-09-02 NOTE — ED Notes (Signed)
Laceration noted to right thigh. Bleeding controlled.

## 2017-09-02 NOTE — ED Triage Notes (Signed)
Pt was at work , cut hi right thigh with  A box cutter, no bleeding through bandage noted, per Lawana Chambersravis Jeffries; supervisor no blood or urine required for [facility]

## 2017-09-02 NOTE — ED Notes (Signed)
Pt verbalized understanding of d/c instructions, medications and follow up 

## 2017-09-14 ENCOUNTER — Other Ambulatory Visit: Payer: Self-pay

## 2017-09-14 ENCOUNTER — Emergency Department
Admission: EM | Admit: 2017-09-14 | Discharge: 2017-09-14 | Disposition: A | Discharge status: Home / Self Care | Payer: Worker's Compensation | Attending: Emergency Medicine | Admitting: Emergency Medicine

## 2017-09-14 ENCOUNTER — Emergency Department: Payer: Worker's Compensation

## 2017-09-14 ENCOUNTER — Encounter: Payer: Self-pay | Admitting: Emergency Medicine

## 2017-09-14 DIAGNOSIS — F17210 Nicotine dependence, cigarettes, uncomplicated: Secondary | ICD-10-CM | POA: Insufficient documentation

## 2017-09-14 DIAGNOSIS — Z4802 Encounter for removal of sutures: Secondary | ICD-10-CM | POA: Insufficient documentation

## 2017-09-14 DIAGNOSIS — T148XXA Other injury of unspecified body region, initial encounter: Secondary | ICD-10-CM

## 2017-09-14 DIAGNOSIS — L089 Local infection of the skin and subcutaneous tissue, unspecified: Secondary | ICD-10-CM | POA: Diagnosis not present

## 2017-09-14 MED ORDER — SULFAMETHOXAZOLE-TRIMETHOPRIM 800-160 MG PO TABS: 1.0000 | ORAL_TABLET | Freq: Two times a day (BID) | ORAL | 0 refills | Status: DC

## 2017-09-14 NOTE — ED Notes (Signed)
See triage note  Presents to have sutures removed from leg today  Area is red and slight swollen  No drainage noted

## 2017-09-14 NOTE — ED Provider Notes (Signed)
[facility] Emergency Department Provider Note  ____________________________________________   First MD Initiated Contact with Patient 09/14/17 1003     (approximate)  I have reviewed the triage vital signs and the nursing notes.   HISTORY  Chief Complaint Leg pain at suture line    HPI Dustin Meyers is a 51 y.o. male is here for suture removal, he states the area has been red and warm and swollen, it hurts for him to bear weight on the right leg, denies fever or chills, he states the laceration was down to the muscle, denies any new injury   Past Medical History:  Diagnosis Date  . Patient denies medical problems     There are no active problems to display for this patient.   History reviewed. No pertinent surgical history.  Prior to Admission medications   Medication Sig Start Date End Date Taking? Authorizing Provider  sulfamethoxazole-trimethoprim (BACTRIM DS,SEPTRA DS) 800-160 MG tablet Take 1 tablet by mouth 2 (two) times daily. 09/14/17   Faythe GheeFisher, Nonie Lochner W, PA-C    Allergies Patient has no known allergies.  History reviewed. No pertinent family history.  Social History Social History   Tobacco Use  . Smoking status: Current Every Day Smoker    Packs/day: 0.50    Types: Cigarettes  . Smokeless tobacco: Never Used  Substance Use Topics  . Alcohol use: Yes  . Drug use: No    Review of Systems  Constitutional: No fever/chills Eyes: No visual changes. ENT: No sore throat. Respiratory: Denies cough Genitourinary: Negative for dysuria. Musculoskeletal: Negative for back pain. Positive right leg pain Skin: Positive for redness and swelling  ____________________________________________   PHYSICAL EXAM:  VITAL SIGNS: ED Triage Vitals  Enc Vitals Group     BP 09/14/17 0936 128/81     Pulse Rate 09/14/17 0936 84     Resp 09/14/17 0936 18     Temp 09/14/17 0936 98.6 F (37 C)     Temp Source 09/14/17 0936 Oral     SpO2  09/14/17 0936 100 %     Weight 09/14/17 0938 175 lb (79.4 kg)     Height 09/14/17 0938 6\' 1"  (1.854 m)     Head Circumference --      Peak Flow --      Pain Score 09/14/17 0938 8     Pain Loc --      Pain Edu? --      Excl. in GC? --     Constitutional: Alert and oriented. Well appearing and in no acute distress. Eyes: Conjunctivae are normal.  Head: Atraumatic. Nose: No congestion/rhinnorhea. Mouth/Throat: Mucous membranes are moist.   Cardiovascular: Normal rate, regular rhythm. Respiratory: Normal respiratory effort.  No retractions GU: deferred Musculoskeletal: FROM all extremities, warm and well perfused, right leg is tender at the distal femur Neurologic:  Normal speech and language.  Skin:  Skin is warm, dry and intact. Sutures are intact, area is a little red and swollen, spreads out about 5 inches in a circular pattern, no drainage noted Psychiatric: Mood and affect are normal. Speech and behavior are normal.  ____________________________________________   LABS (all labs ordered are listed, but only abnormal results are displayed)  Labs Reviewed - No data to display ____________________________________________   ____________________________________________  RADIOLOGY  X-ray of the femur is negative  ____________________________________________   PROCEDURES  Procedure(s) performed: No      ____________________________________________   INITIAL IMPRESSION / ASSESSMENT AND PLAN / ED COURSE  Pertinent labs & imaging results that were available during my care of the patient were reviewed by me and considered in my medical decision making (see chart for details).  A shows 51 year old male had a laceration over 10 days ago, laceration was repaired in the ED, was told to return today to have the sutures out, patient's complaining of redness swelling and pain in the right leg, he states he just can't go back to work; on exam the right leg is a little red and  swollen around the laceration/suture line, patient is afebrile, area is tender, x-ray of the distal femur ordered ----------------------------------------- 1:54 PM on 09/14/2017 -----------------------------------------  X-ray of the femur was negative, diagnosis is wound infection, suture removal, prescription for Septra DS 1 by mouth twice a day was given to the patient, he is arguing about not being able to work so I gave him today and tomorrow, told him there was no other reason to not be at work, patient is upset wants at least a week off     ____________________________________________   FINAL CLINICAL IMPRESSION(S) / ED DIAGNOSES  Final diagnoses:  Wound infection  Visit for suture removal      NEW MEDICATIONS STARTED DURING THIS VISIT:  This SmartLink is deprecated. Use AVSMEDLIST instead to display the medication list for a patient.   Note:  This document was prepared using Dragon voice recognition software and may include unintentional dictation errors.    Faythe GheeFisher, Thor Nannini W, PA-C 09/14/17 1355    Jene EveryKinner, Robert, MD 09/14/17 [phone]

## 2017-09-14 NOTE — Discharge Instructions (Signed)
Use the medication as prescribed, apply a warm compress to the area, return to the emergency department if you are running a fever or if there is more redness and swelling, otherwise follow-up with [facility] if you have any other problems

## 2017-09-14 NOTE — ED Triage Notes (Signed)
Pt reports he thinks his sutures are due to come out today. Pt reports pain at wound site. No drainage or swelling noted at suture line. Redness noted surrounding wound. Pt ambulatory to triage.

## 2018-12-15 ENCOUNTER — Encounter: Payer: Self-pay | Admitting: Emergency Medicine

## 2018-12-15 ENCOUNTER — Emergency Department
Admission: EM | Admit: 2018-12-15 | Discharge: 2018-12-15 | Disposition: A | Discharge status: Home / Self Care | Payer: No Typology Code available for payment source | Attending: Emergency Medicine | Admitting: Emergency Medicine

## 2018-12-15 ENCOUNTER — Other Ambulatory Visit: Payer: Self-pay

## 2018-12-15 DIAGNOSIS — Y939 Activity, unspecified: Secondary | ICD-10-CM | POA: Diagnosis not present

## 2018-12-15 DIAGNOSIS — M25511 Pain in right shoulder: Secondary | ICD-10-CM | POA: Insufficient documentation

## 2018-12-15 DIAGNOSIS — Y929 Unspecified place or not applicable: Secondary | ICD-10-CM | POA: Insufficient documentation

## 2018-12-15 DIAGNOSIS — Y990 Civilian activity done for income or pay: Secondary | ICD-10-CM | POA: Diagnosis not present

## 2018-12-15 DIAGNOSIS — T754XXA Electrocution, initial encounter: Secondary | ICD-10-CM | POA: Diagnosis not present

## 2018-12-15 DIAGNOSIS — F17210 Nicotine dependence, cigarettes, uncomplicated: Secondary | ICD-10-CM | POA: Diagnosis not present

## 2018-12-15 LAB — COMPREHENSIVE METABOLIC PANEL
ALT: 42 U/L (ref 0–44)
ANION GAP: 7 (ref 5–15)
AST: 45 U/L — AB (ref 15–41)
Albumin: 4.5 g/dL (ref 3.5–5.0)
Alkaline Phosphatase: 46 U/L (ref 38–126)
BUN: 12 mg/dL (ref 6–20)
CO2: 26 mmol/L (ref 22–32)
Calcium: 9 mg/dL (ref 8.9–10.3)
Chloride: 106 mmol/L (ref 98–111)
Creatinine, Ser: 0.95 mg/dL (ref 0.61–1.24)
GFR calc Af Amer: >60 mL/min (ref >60)
GFR calc non Af Amer: >60 mL/min (ref >60)
GLUCOSE: 104 mg/dL — AB (ref 70–99)
POTASSIUM: 4.2 mmol/L (ref 3.5–5.1)
Sodium: 139 mmol/L (ref 135–145)
Total Bilirubin: 0.7 mg/dL (ref 0.3–1.2)
Total Protein: 7.1 g/dL (ref 6.5–8.1)

## 2018-12-15 LAB — CBC
HCT: 46.3 % (ref 39.0–52.0)
Hemoglobin: 15.2 g/dL (ref 13.0–17.0)
MCH: 32.6 pg (ref 26.0–34.0)
MCHC: 32.8 g/dL (ref 30.0–36.0)
MCV: 99.4 fL (ref 80.0–100.0)
Platelets: 246 10*3/uL (ref 150–400)
RBC: 4.66 MIL/uL (ref 4.22–5.81)
RDW: 12.6 % (ref 11.5–15.5)
WBC: 6.8 10*3/uL (ref 4.0–10.5)
nRBC: 0 % (ref 0.0–0.2)

## 2018-12-15 LAB — CK: CK TOTAL: 1111 U/L — AB (ref 49–397)

## 2018-12-15 LAB — TROPONIN I: Troponin I: <0.03 ng/mL (ref <0.03)

## 2018-12-15 MED ORDER — TRAMADOL HCL 50 MG PO TABS: 50.0000 mg | ORAL_TABLET | Freq: Four times a day (QID) | ORAL | 0 refills | Status: DC | PRN

## 2018-12-15 MED ORDER — GABAPENTIN 100 MG PO CAPS: 100.0000 mg | ORAL_CAPSULE | Freq: Three times a day (TID) | ORAL | 0 refills | Status: DC

## 2018-12-15 NOTE — ED Provider Notes (Signed)
[facility] Emergency Department Provider Note  ____________________________________________  Time seen: Approximately 2:27 PM  I have reviewed the triage vital signs and the nursing notes.   HISTORY  Chief Complaint Electric Shock    HPI Dustin Meyers is a 53 y.o. male with no significant medical problems who comes the ED complaining of electric shock to his right arm that happened yesterday.  He was going through a turnstile at work and when he put his right arm on the metal bar to push through it, he felt a jolt in his arm.  He has had constant right arm pain since then, worse in the shoulder.  Feels like burning and paresthesia although he does still have sensation throughout the arm.  Denies chest pain shortness of breath palpitations dizziness or syncope.  Symptoms do not have any aggravating or alleviating factors.  Moderate intensity.      Past Medical History:  Diagnosis Date  . Patient denies medical problems      There are no active problems to display for this patient.    History reviewed. No pertinent surgical history.   Prior to Admission medications   Medication Sig Start Date End Date Taking? Authorizing Provider  gabapentin (NEURONTIN) 100 MG capsule Take 1 capsule (100 mg total) by mouth 3 (three) times daily for 30 days. 12/15/18 01/14/19  Sharman Cheek, MD  sulfamethoxazole-trimethoprim (BACTRIM DS,SEPTRA DS) 800-160 MG tablet Take 1 tablet by mouth 2 (two) times daily. Patient not taking: Reported on 12/15/2018 09/14/17   Faythe Ghee, PA-C  traMADol (ULTRAM) 50 MG tablet Take 1 tablet (50 mg total) by mouth every 6 (six) hours as needed. 12/15/18   Sharman Cheek, MD     Allergies Patient has no known allergies.   No family history on file.  Social History Social History   Tobacco Use  . Smoking status: Current Every Day Smoker    Packs/day: 0.50    Types: Cigarettes  . Smokeless tobacco: Never Used  Substance Use  Topics  . Alcohol use: Yes  . Drug use: No    Review of Systems  Constitutional:   No fever or chills.  ENT:   No sore throat. No rhinorrhea. Cardiovascular:   No chest pain or syncope. Respiratory:   No dyspnea or cough. Gastrointestinal:   Negative for abdominal pain, vomiting and diarrhea.  Musculoskeletal: Right arm pain as above All other systems reviewed and are negative except as documented above in ROS and HPI.  ____________________________________________   PHYSICAL EXAM:  VITAL SIGNS: ED Triage Vitals  Enc Vitals Group     BP 12/15/18 0952 119/74     Pulse Rate 12/15/18 0952 69     Resp 12/15/18 0952 20     Temp 12/15/18 0952 98.6 F (37 C)     Temp Source 12/15/18 0952 Oral     SpO2 12/15/18 0952 98 %     Weight 12/15/18 0952 205 lb (93 kg)     Height 12/15/18 0952 6\' 1"  (1.854 m)     Head Circumference --      Peak Flow --      Pain Score 12/15/18 1002 8     Pain Loc --      Pain Edu? --      Excl. in GC? --     Vital signs reviewed, nursing assessments reviewed.   Constitutional:   Alert and oriented. Non-toxic appearance. Eyes:   Conjunctivae are normal. EOMI.  ENT  Head:   Normocephalic and atraumatic.      Nose:   No congestion/rhinnorhea.       Mouth/Throat:   MMM      Neck:   No meningismus. Full ROM. Hematological/Lymphatic/Immunilogical:   No cervical lymphadenopathy. Cardiovascular:   RRR. Symmetric bilateral radial and DP pulses.  No murmurs. Cap refill less than 2 seconds. Respiratory:   Normal respiratory effort without tachypnea/retractions. Breath sounds are clear and equal bilaterally. No wheezes/rales/rhonchi. Gastrointestinal:   Soft and nontender. Non distended. There is no CVA tenderness.  No rebound, rigidity, or guarding.  Musculoskeletal:   Normal range of motion in all extremities. No joint effusions.  No lower extremity tenderness.  No edema.  Compartments soft. Neurologic:   Normal speech and language.  Motor grossly  intact. No acute focal neurologic deficits are appreciated.  Skin:    Skin is warm, dry and intact. No rash noted.  No petechiae, purpura, or bullae.  No wounds or cutaneous burns  ____________________________________________    LABS (pertinent positives/negatives) (all labs ordered are listed, but only abnormal results are displayed) Labs Reviewed  CK - Abnormal; Notable for the following components:      Result Value   Total CK 1,111 (*)    All other components within normal limits  COMPREHENSIVE METABOLIC PANEL - Abnormal; Notable for the following components:   Glucose, Bld 104 (*)    AST 45 (*)    All other components within normal limits  TROPONIN I  CBC   ____________________________________________   EKG  Interpreted by me  Date: 12/15/2018  Rate: 68  Rhythm: normal sinus rhythm  QRS Axis: normal  Intervals: normal  ST/T Wave abnormalities: normal  Conduction Disutrbances: none  Narrative Interpretation: unremarkable      ____________________________________________    RADIOLOGY  No results found.  ____________________________________________   PROCEDURES Procedures  ____________________________________________    CLINICAL IMPRESSION / ASSESSMENT AND PLAN / ED COURSE  Medications ordered in the ED: Medications - No data to display  Pertinent labs & imaging results that were available during my care of the patient were reviewed by me and considered in my medical decision making (see chart for details).    Patient presents with suspected electric shock injury to the right arm.  No signs or symptoms of dysrhythmia.  Doubt compartment syndrome dislocation or other acute injury.  He does have some paresthesia/neuropathy from the injury.  I will refer him to orthopedics for further evaluation of his shoulder pain although it is located and without evidence of fracture.  Since his work involves routine lifting of heavy objects and is very strenuous, I  think it is worth having him follow-up with orthopedics prior to returning to full duty.  Work note provided for light duty and being off of the next 3 days.      ____________________________________________   FINAL CLINICAL IMPRESSION(S) / ED DIAGNOSES    Final diagnoses:  Electrocution and nonfatal effects of electric current, initial encounter  Acute pain of right shoulder     ED Discharge Orders         Ordered    traMADol (ULTRAM) 50 MG tablet  Every 6 hours PRN     12/15/18 1425    gabapentin (NEURONTIN) 100 MG capsule  3 times daily     12/15/18 1425          Portions of this note were generated with dragon dictation software. Dictation errors may occur despite best attempts at proofreading.  Sharman Cheek, MD 12/15/18 1431

## 2018-12-15 NOTE — ED Notes (Signed)
First Nurse Note: Workmens Comp - paperwork printed.

## 2018-12-15 NOTE — ED Notes (Signed)
First Nurse Note: Patient to ED from Occupational Health.  He reportedly grabbed a gate yesterday and sustained a shock, complaining of numbness in right arm with discoloration.  Accompanied by Renaldo Harrison NP.

## 2018-12-15 NOTE — ED Triage Notes (Signed)
Pt reports was coming through a gate at work and when he scanned his badge he got a little shock and then he put his hand on the rod to walk through and a shock went through his right arm and he has had pain every since. Pt was sent to occupational health who advised him to come here.

## 2018-12-15 NOTE — ED Notes (Signed)
First Nurse Note:  Patient EKG completed and read by Dr. Cyril Loosen

## 2018-12-17 ENCOUNTER — Other Ambulatory Visit: Payer: Self-pay

## 2018-12-17 ENCOUNTER — Encounter: Payer: Self-pay | Admitting: Emergency Medicine

## 2018-12-17 ENCOUNTER — Emergency Department
Admission: EM | Admit: 2018-12-17 | Discharge: 2018-12-17 | Disposition: A | Discharge status: Home / Self Care | Payer: No Typology Code available for payment source | Attending: Emergency Medicine | Admitting: Emergency Medicine

## 2018-12-17 DIAGNOSIS — M25511 Pain in right shoulder: Secondary | ICD-10-CM | POA: Diagnosis not present

## 2018-12-17 DIAGNOSIS — R52 Pain, unspecified: Secondary | ICD-10-CM | POA: Insufficient documentation

## 2018-12-17 DIAGNOSIS — F17210 Nicotine dependence, cigarettes, uncomplicated: Secondary | ICD-10-CM | POA: Insufficient documentation

## 2018-12-17 MED ORDER — OXYCODONE-ACETAMINOPHEN 5-325 MG PO TABS: 1.0000 | ORAL_TABLET | Freq: Four times a day (QID) | ORAL | 0 refills | Status: AC | PRN

## 2018-12-17 NOTE — ED Provider Notes (Signed)
[facility] Emergency Department Provider Note  ____________________________________________   First MD Initiated Contact with Patient 12/17/18 1359     (approximate)  I have reviewed the triage vital signs and the nursing notes.   HISTORY  Chief Complaint pain meds   HPI Dustin Meyers is a 53 y.o. male presents to the ED with complaint of continued pain.  Patient was seen on 12/17/2018 after his Workmen's Comp. injury in which he had an electrical shock.  He was seen by Dr. Scotty Court at which time it was determined that he did not have any cardiac injury.  He is to follow-up with an orthopedist about his shoulder pain.  Patient states that so far he has not been scheduled with an orthopedist because of the paperwork that is involved in following Workmen's Comp.  He is here for something stronger than the tramadol that was prescribed for him.  He states that this is not helping with his pain.  He does continue to take the gabapentin.  Patient rates his pain as a 10/10.       Past Medical History:  Diagnosis Date  . Patient denies medical problems     There are no active problems to display for this patient.   History reviewed. No pertinent surgical history.  Prior to Admission medications   Medication Sig Start Date End Date Taking? Authorizing Provider  gabapentin (NEURONTIN) 100 MG capsule Take 1 capsule (100 mg total) by mouth 3 (three) times daily for 30 days. 12/15/18 01/14/19  Sharman Cheek, MD  oxyCODONE-acetaminophen (PERCOCET) 5-325 MG tablet Take 1 tablet by mouth every 6 (six) hours as needed for severe pain. 12/17/18 12/17/19  Tommi Rumps, PA-C  traMADol (ULTRAM) 50 MG tablet Take 1 tablet (50 mg total) by mouth every 6 (six) hours as needed. 12/15/18   Sharman Cheek, MD    Allergies Patient has no known allergies.  History reviewed. No pertinent family history.  Social History Social History   Tobacco Use  . Smoking status:  Current Every Day Smoker    Packs/day: 0.50    Types: Cigarettes  . Smokeless tobacco: Never Used  Substance Use Topics  . Alcohol use: Yes  . Drug use: No    Review of Systems Constitutional: No fever/chills Cardiovascular: Denies chest pain. Respiratory: Denies shortness of breath. Gastrointestinal:  No nausea, no vomiting.  Musculoskeletal: Positive right shoulder pain. Skin: Negative for rash. Neurological: Negative for headaches, focal weakness or numbness. ____________________________________________   PHYSICAL EXAM:  VITAL SIGNS: ED Triage Vitals  Enc Vitals Group     BP --      Pulse --      Resp --      Temp 12/17/18 1346 98.2 F (36.8 C)     Temp Source 12/17/18 1346 Oral     SpO2 --      Weight 12/17/18 1345 205 lb (93 kg)     Height 12/17/18 1345 6\' 1"  (1.854 m)     Head Circumference --      Peak Flow --      Pain Score 12/17/18 1345 10     Pain Loc --      Pain Edu? --      Excl. in GC? --     Constitutional: Alert and oriented. Well appearing and in no acute distress. Eyes: Conjunctivae are normal.  Head: Atraumatic. Nose: No congestion/rhinnorhea. Neck: No stridor.   Cardiovascular: Normal rate, regular rhythm. Grossly normal heart sounds.  Good peripheral circulation. Respiratory: Normal respiratory effort.  No retractions. Lungs CTAB. Musculoskeletal: Gross deformities noted of the right shoulder.  There is no soft tissue injury or discoloration noted.  No point tenderness is noted on exam.  Pulses present distally. Neurologic:  Normal speech and language. No gross focal neurologic deficits are appreciated. Skin:  Skin is warm, dry and intact. No rash noted.  Psychiatric: Mood and affect are normal. Speech and behavior are normal.  ____________________________________________   LABS (all labs ordered are listed, but only abnormal results are displayed)  Labs Reviewed - No data to display   PROCEDURES  Procedure(s) performed (including  Critical Care):  Procedures   ____________________________________________   INITIAL IMPRESSION / ASSESSMENT AND PLAN / ED COURSE  As part of my medical decision making, I reviewed the following data within the electronic MEDICAL RECORD NUMBER Notes from prior ED visits and [place] Controlled Substance Database     Patient presents to the ED after being seen 2 days ago at which time he received an electrical shock to his right arm when he touched a turnstile at work.  He is still waiting to be seen at [facility] orthopedic department after his Workmen's Comp. papers have been completed.  He states that he was given tramadol for his pain along with a prescription for Neurontin.  Patient's visit today is to obtain something stronger for pain as the tramadol is not working.  He is encouraged to have his company expedite his paper so that he can see an orthopedist.  He was given a prescription for Percocet 5 mg/325 1 every 6 hours #20.  He is also to continue the Neurontin as directed.   ____________________________________________   FINAL CLINICAL IMPRESSION(S) / ED DIAGNOSES  Final diagnoses:  Encounter for pain management  Acute pain of right shoulder     ED Discharge Orders         Ordered    oxyCODONE-acetaminophen (PERCOCET) 5-325 MG tablet  Every 6 hours PRN     12/17/18 1424           Note:  This document was prepared using Dragon voice recognition software and may include unintentional dictation errors.    Tommi Rumps, PA-C 12/17/18 1501    Dionne Bucy, MD 12/17/18 [phone]

## 2018-12-17 NOTE — Discharge Instructions (Signed)
Call and make an appointment with Dr. Joice Lofts at the [facility] orthopedic department.  Discontinue taking the tramadol at this time but continue taking the gabapentin that was prescribed for you.  A prescription for oxycodone with acetaminophen was sent to your pharmacy.  Take this only as directed.  Do not drive or operate machinery while taking this medication.

## 2018-12-17 NOTE — ED Triage Notes (Signed)
Pt here for electric shock Wednesday.  Was given tramadol and is supposed to FU with specialist but cannot see them until all worker comp stuff goes through.  Would like something stronger for pain for right shoulder pain until he can see them, reports tramadol does not help at all.

## 2018-12-17 NOTE — ED Notes (Signed)
See triage note  Presents with cont's pain  States he was seen 2 days ago after having an electric shock

## 2018-12-27 IMAGING — CR DG FEMUR 2+V*R*
1 series · 4 of 4 positions shown · non-contrast
Comparison: None.

CLINICAL DATA: Patient had a laceration on his distal femur [REDACTED] [DATE]. Stitches have been removed but is still having
pain with any weightbearing throughout his right femur.

EXAM:
RIGHT FEMUR 2 VIEWS

[Series 1: dg femur, min 2 views right · 0.14mm/px · 4 of 4 slices shown]
[im 1/4]
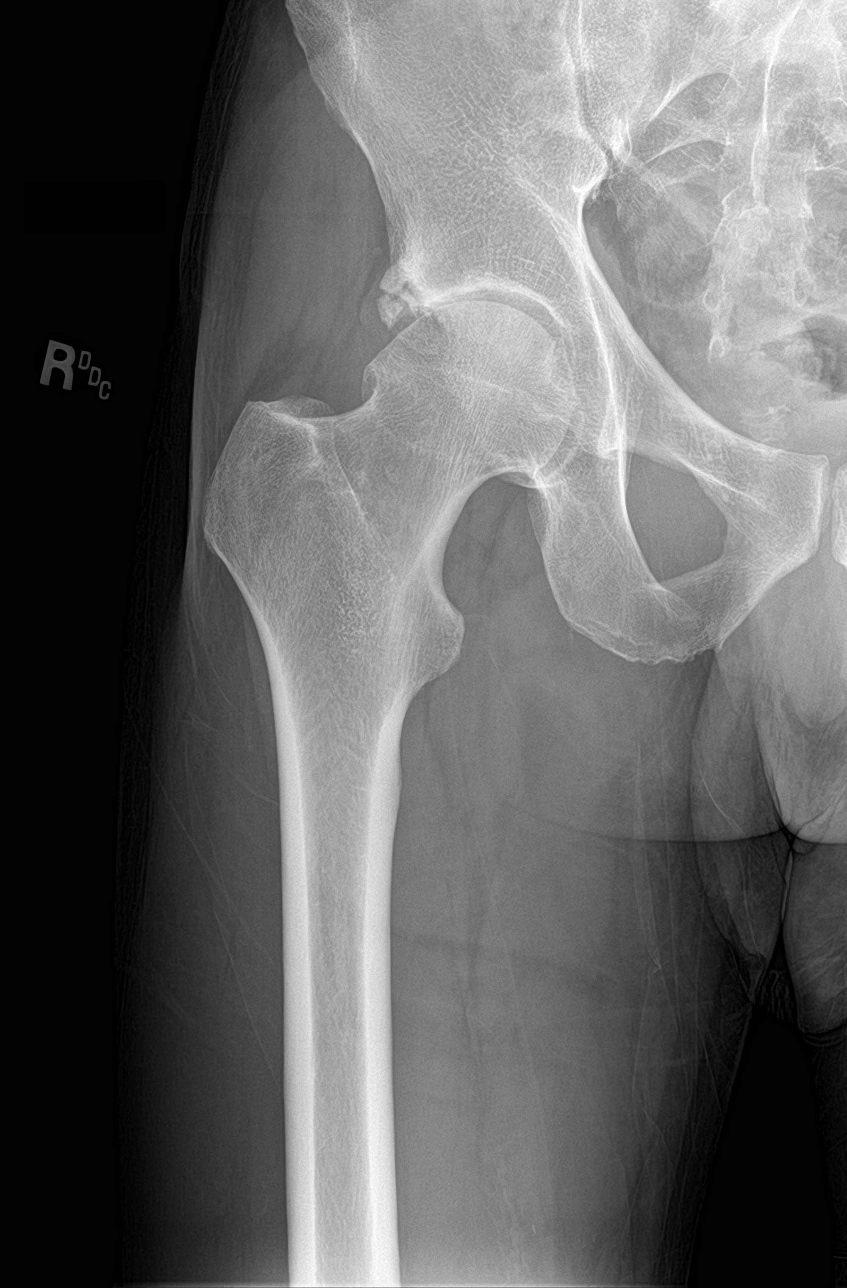
[im 2/4]
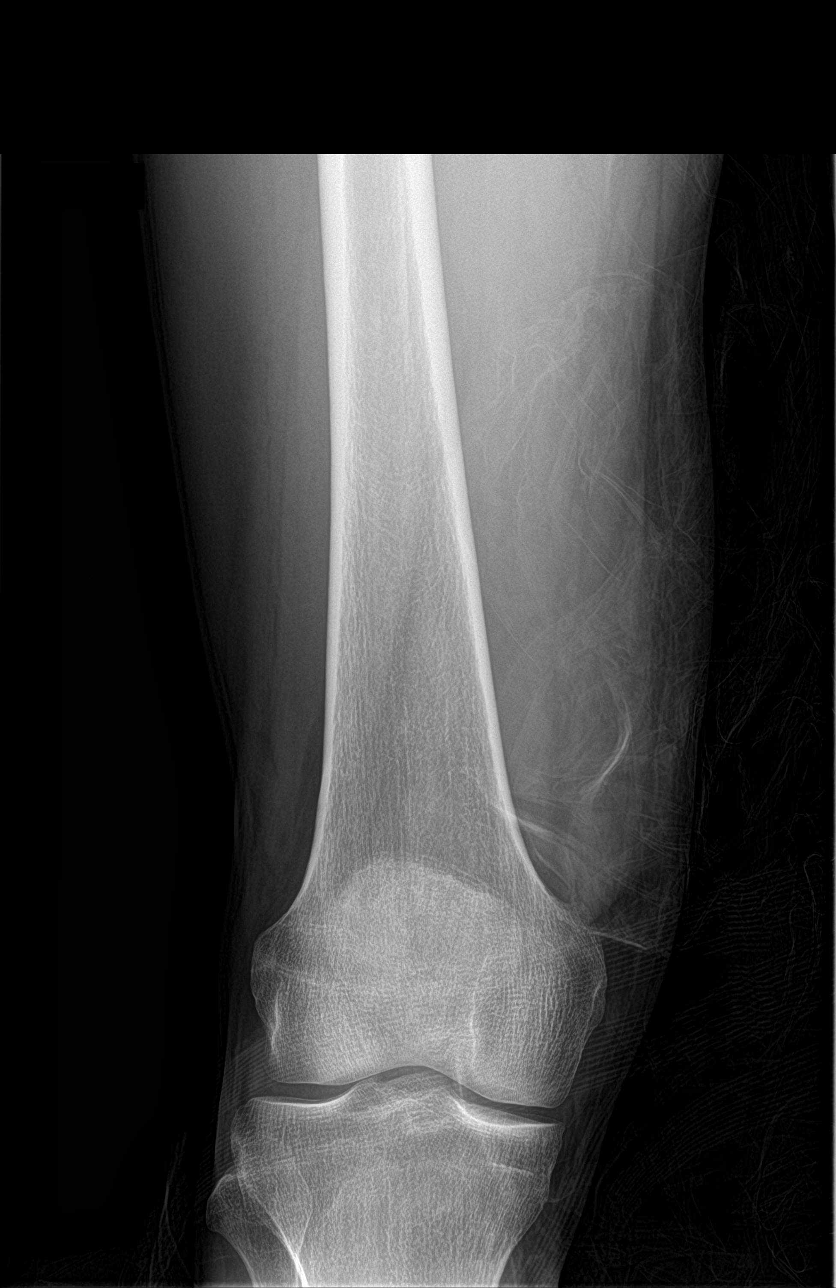
[im 3/4]
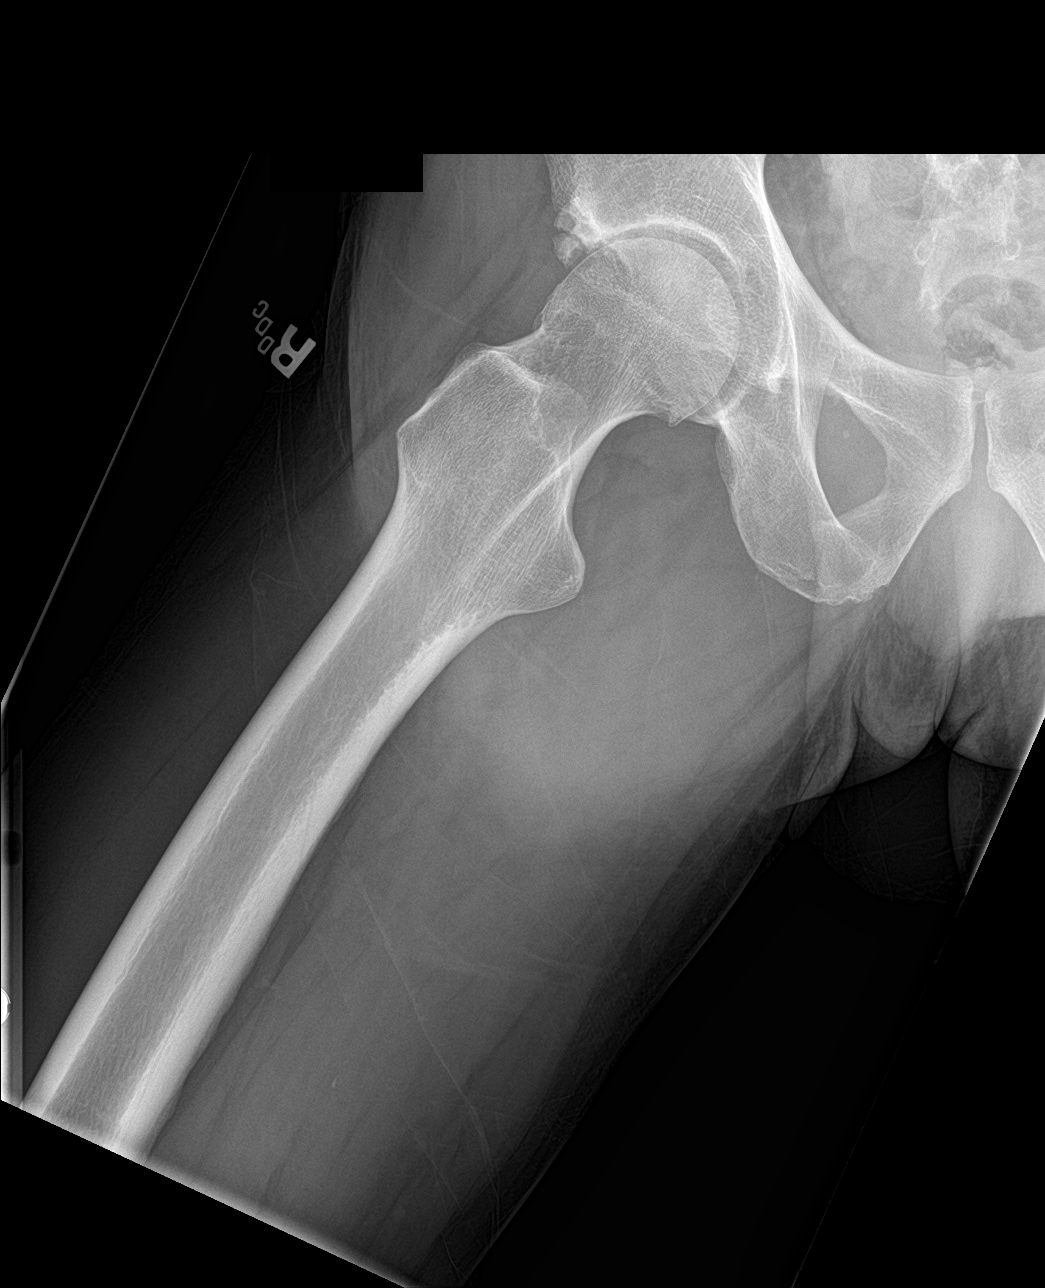
[im 4/4]
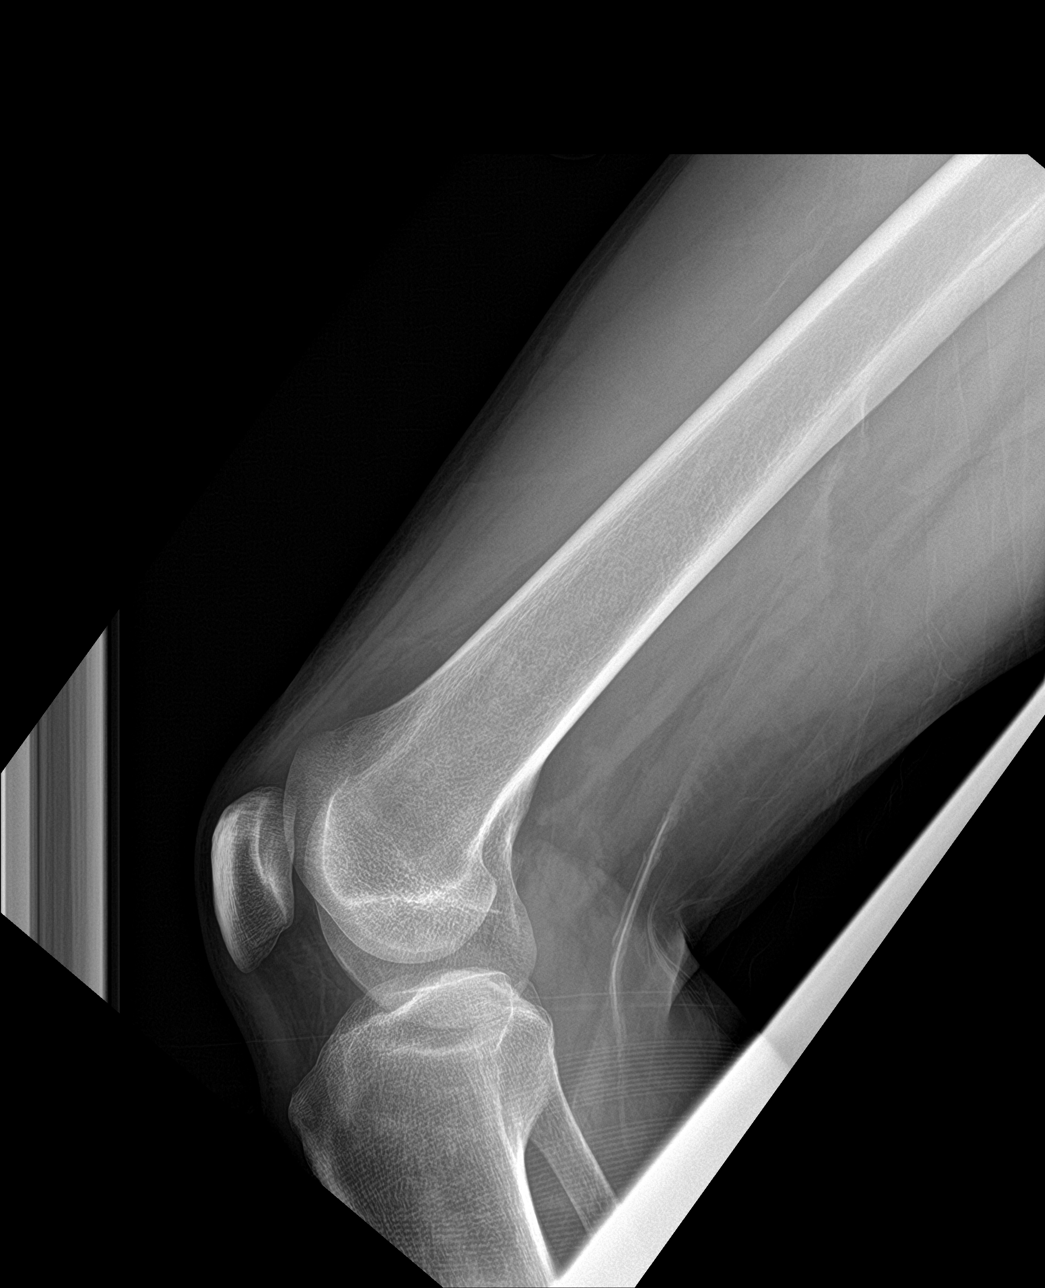

[4 of 4 positions shown; findings below may reference images not displayed]

FINDINGS: No acute fracture or dislocation. No knee joint effusion. No soft
tissue gas. Mild degenerate changes about the hip.
IMPRESSION: No acute osseous abnormality.

## 2019-07-19 ENCOUNTER — Other Ambulatory Visit: Payer: Self-pay

## 2019-07-19 ENCOUNTER — Ambulatory Visit (INDEPENDENT_AMBULATORY_CARE_PROVIDER_SITE_OTHER): Payer: BLUE CROSS/BLUE SHIELD | Admitting: Family Medicine

## 2019-07-19 ENCOUNTER — Encounter: Payer: Self-pay | Admitting: Family Medicine

## 2019-07-19 VITALS — BP 118/78 | HR 82 | Temp 97.1°F | Resp 14 | Ht 73.0 in | Wt 210.3 lb

## 2019-07-19 DIAGNOSIS — Z7689 Persons encountering health services in other specified circumstances: Secondary | ICD-10-CM | POA: Diagnosis not present

## 2019-07-19 DIAGNOSIS — Z1211 Encounter for screening for malignant neoplasm of colon: Secondary | ICD-10-CM | POA: Diagnosis not present

## 2019-07-19 DIAGNOSIS — Z13228 Encounter for screening for other metabolic disorders: Secondary | ICD-10-CM

## 2019-07-19 DIAGNOSIS — Z0000 Encounter for general adult medical examination without abnormal findings: Secondary | ICD-10-CM

## 2019-07-19 DIAGNOSIS — Z1329 Encounter for screening for other suspected endocrine disorder: Secondary | ICD-10-CM | POA: Diagnosis not present

## 2019-07-19 DIAGNOSIS — Z130 Encounter for screening for diseases of the blood and blood-forming organs and certain disorders involving the immune mechanism: Secondary | ICD-10-CM

## 2019-07-19 DIAGNOSIS — Z114 Encounter for screening for human immunodeficiency virus [HIV]: Secondary | ICD-10-CM

## 2019-07-19 DIAGNOSIS — R748 Abnormal levels of other serum enzymes: Secondary | ICD-10-CM

## 2019-07-19 DIAGNOSIS — Z13220 Encounter for screening for lipoid disorders: Secondary | ICD-10-CM

## 2019-07-19 NOTE — Patient Instructions (Signed)
Preventive Care 40-53 Years Old, Male Preventive care refers to lifestyle choices and visits with your health care provider that can promote health and wellness. This includes:  A yearly physical exam. This is also called an annual well check.  Regular dental and eye exams.  Immunizations.  Screening for certain conditions.  Healthy lifestyle choices, such as eating a healthy diet, getting regular exercise, not using drugs or products that contain nicotine and tobacco, and limiting alcohol use. What can I expect for my preventive care visit? Physical exam Your health care provider will check:  Height and weight. These may be used to calculate body mass index (BMI), which is a measurement that tells if you are at a healthy weight.  Heart rate and blood pressure.  Your skin for abnormal spots. Counseling Your health care provider may ask you questions about:  Alcohol, tobacco, and drug use.  Emotional well-being.  Home and relationship well-being.  Sexual activity.  Eating habits.  Work and work environment. What immunizations do I need?  Influenza (flu) vaccine  This is recommended every year. Tetanus, diphtheria, and pertussis (Tdap) vaccine  You may need a Td booster every 10 years. Varicella (chickenpox) vaccine  You may need this vaccine if you have not already been vaccinated. Zoster (shingles) vaccine  You may need this after age 53. Measles, mumps, and rubella (MMR) vaccine  You may need at least one dose of MMR if you were born in 1957 or later. You may also need a second dose. Pneumococcal conjugate (PCV13) vaccine  You may need this if you have certain conditions and were not previously vaccinated. Pneumococcal polysaccharide (PPSV23) vaccine  You may need one or two doses if you smoke cigarettes or if you have certain conditions. Meningococcal conjugate (MenACWY) vaccine  You may need this if you have certain conditions. Hepatitis A vaccine   You may need this if you have certain conditions or if you travel or work in places where you may be exposed to hepatitis A. Hepatitis B vaccine  You may need this if you have certain conditions or if you travel or work in places where you may be exposed to hepatitis B. Haemophilus influenzae type b (Hib) vaccine  You may need this if you have certain risk factors. Human papillomavirus (HPV) vaccine  If recommended by your health care provider, you may need three doses over 6 months. You may receive vaccines as individual doses or as more than one vaccine together in one shot (combination vaccines). Talk with your health care provider about the risks and benefits of combination vaccines. What tests do I need? Blood tests  Lipid and cholesterol levels. These may be checked every 5 years, or more frequently if you are over 50 years old.  Hepatitis C test.  Hepatitis B test. Screening  Lung cancer screening. You may have this screening every year starting at age 53 if you have a 30-pack-year history of smoking and currently smoke or have quit within the past 15 years.  Prostate cancer screening. Recommendations will vary depending on your family history and other risks.  Colorectal cancer screening. All adults should have this screening starting at age 53 and continuing until age 75. Your health care provider may recommend screening at age 45 if you are at increased risk. You will have tests every 1-10 years, depending on your results and the type of screening test.  Diabetes screening. This is done by checking your blood sugar (glucose) after you have not eaten   for a while (fasting). You may have this done every 1-3 years.  Sexually transmitted disease (STD) testing. Follow these instructions at home: Eating and drinking  Eat a diet that includes fresh fruits and vegetables, whole grains, lean protein, and low-fat dairy products.  Take vitamin and mineral supplements as recommended  by your health care provider.  Do not drink alcohol if your health care provider tells you not to drink.  If you drink alcohol: ? Limit how much you have to 0-2 drinks a day. ? Be aware of how much alcohol is in your drink. In the [place] one drink equals one 12 oz bottle of beer (355 mL), one 5 oz glass of wine (148 mL), or one 1 oz glass of hard liquor (44 mL). Lifestyle  Take daily care of your teeth and gums.  Stay active. Exercise for at least 30 minutes on 5 or more days each week.  Do not use any products that contain nicotine or tobacco, such as cigarettes, e-cigarettes, and chewing tobacco. If you need help quitting, ask your health care provider.  If you are sexually active, practice safe sex. Use a condom or other form of protection to prevent STIs (sexually transmitted infections).  Talk with your health care provider about taking a low-dose aspirin every day starting at age 53. What's next?  Go to your health care provider once a year for a well check visit.  Ask your health care provider how often you should have your eyes and teeth checked.  Stay up to date on all vaccines. This information is not intended to replace advice given to you by your health care provider. Make sure you discuss any questions you have with your health care provider. Document Released: 10/26/2015 Document Revised: 09/23/2018 Document Reviewed: 09/23/2018 Elsevier Patient Education  2020 [employer].

## 2019-07-19 NOTE — Progress Notes (Signed)
Patient: Dustin Meyers, Male    DOB: 04-Oct-1966, 53 y.o.   MRN: [id number] Dustin Grana, PA-C Visit Date: 07/19/2019  Today's Provider: Delsa Grana, PA-C   Chief Complaint  Patient presents with  . New Patient (Initial Visit)   Subjective:   New pt to establish care here Hasn't had a PCP "since he was a kid" Recently had right shoulder surgery ~ 3 months ago, having still daily moderate to severe pain that wakes him up at night, has f/up with ortho in the next couple this week 9th.  Was a workplace injury.  Taking ibuprofen.   Annual physical exam:  Dustin Meyers is a 53 y.o. male who presents today for health maintenance and annual & complete physical exam.  He feels well.  He reports exercising not much, but used to be very active. Diet generally described by pt as "healthy" He reports he is sleeping fairly well - some waking in early hours with shoulder pain   USPSTF grade A and B recommendations - reviewed and addressed today  Depression:  Phq 9 completed today by patient, was reviewed by me with patient in the room, score is  negative, pt feels good PHQ 2/9 Scores 07/19/2019  PHQ - 2 Score 0  PHQ- 9 Score 0   Depression screen PHQ 2/9 07/19/2019  Decreased Interest 0  Down, Depressed, Hopeless 0  PHQ - 2 Score 0  Altered sleeping 0  Tired, decreased energy 0  Change in appetite 0  Feeling bad or failure about yourself  0  Trouble concentrating 0  Moving slowly or fidgety/restless 0  Suicidal thoughts 0  PHQ-9 Score 0  Difficult doing work/chores Not difficult at all    Hep C Screening: none indicated STD testing and prevention (HIV/chl/gon/syphilis): HIV today, no others wanted Prostate cancer: no sx, will not do PSA today, Prostate cancer screening with PSA: Discussed risks and benefits of PSA testing and provided handout. Pt declines to have PSA drawn today.  No results found for: PSA Intimate partner violence:  Safe, no abuse  Urinary Symptoms:  IPSS  Questionnaire (AUA-7): Over the past month.   1)  How often have you had a sensation of not emptying your bladder completely after you finish urinating?  0 - Not at all  2)  How often have you had to urinate again less than two hours after you finished urinating? 0 - Not at all  3)  How often have you found you stopped and started again several times when you urinated?  0 - Not at all  4) How difficult have you found it to postpone urination?  0 - Not at all  5) How often have you had a weak urinary stream?  0 - Not at all  6) How often have you had to push or strain to begin urination?  0 - Not at all  7) How many times did you most typically get up to urinate from the time you went to bed until the time you got up in the morning?  0 - None  Total score:  0-7 mildly symptomatic   8-19 moderately symptomatic   20-35 severely symptomatic    Advanced Care Planning:  A voluntary discussion about advance care planning including the explanation and discussion of advance directives.  Discussed health care proxy and Living will, and the patient was able to identify a health care proxy as her daughter, Dustin Meyers.  Patient does not have a living  will at present time. If patient does have living will, I have requested they bring this to the clinic to be scanned in to their chart.   Colorectal cancer:  colonoscopy is due - referral ordered    Lung cancer:  Low Dose CT Chest recommended if Age 71-80 years, 30 pack-year currently smoking OR have quit w/in 15years. Patient does not qualify.   Social History   Tobacco Use  . Smoking status: Current Every Day Smoker    Packs/day: 1.00    Years: 40.00    Pack years: 40.00    Types: Cigarettes  . Smokeless tobacco: Never Used  Substance Use Topics  . Alcohol use: Yes    Alcohol/week: 4.0 standard drinks    Types: 4 Cans of beer per week    Comment: occassionally      Alcohol screening:   [facility] Visit from 07/19/2019 in [facility]  AUDIT-C Score  1      AAA: n/a yet The USPSTF recommends one-time screening with ultrasonography in men ages 57 to 30 years who have ever smoked  ECG: done in the ER 12/2018  Blood pressure/Hypertension: BP Readings from Last 3 Encounters:  07/19/19 118/78  12/15/18 120/81  09/14/17 128/81   Weight/Obesity: Wt Readings from Last 3 Encounters:  07/19/19 210 lb 4.8 oz (95.4 kg)  12/17/18 205 lb (93 kg)  12/15/18 205 lb (93 kg)   BMI Readings from Last 3 Encounters:  07/19/19 27.75 kg/m  12/17/18 27.05 kg/m  12/15/18 27.05 kg/m    Lipids:  No results found for: CHOL No results found for: HDL No results found for: LDLCALC No results found for: TRIG No results found for: CHOLHDL No results found for: LDLDIRECT Based on the results of lipid panel his/her cardiovascular risk factor ( using [place] Cohort )  in the next 10 years is : The ASCVD Risk score Denman George DC Jr., et al., 2013) failed to calculate for the following reasons:   Cannot find a previous HDL lab   Cannot find a previous total cholesterol lab Glucose:  Glucose, Bld  Date Value Ref Range Status  12/15/2018 104 (H) 70 - 99 mg/dL Final  72/53/6644 034 (H) 65 - 99 mg/dL Final    Social History      He  reports that he has been smoking cigarettes. He has a 40.00 pack-year smoking history. He has never used smokeless tobacco. He reports current alcohol use of about 4.0 standard drinks of alcohol per week.       Social History   Socioeconomic History  . Marital status: Legally Separated    Spouse name: Not on file  . Number of children: 1  . Years of education: 52  . Highest education level: High school graduate  Occupational History  . Not on file  Social Needs  . Financial resource strain: Not hard at all  . Food insecurity    Worry: Never true    Inability: Never true  . Transportation needs    Medical: Yes    Non-medical: Yes  Tobacco Use  . Smoking status: Current Every Day Smoker     Packs/day: 1.00    Years: 40.00    Pack years: 40.00    Types: Cigarettes  . Smokeless tobacco: Never Used  Substance and Sexual Activity  . Alcohol use: Yes    Alcohol/week: 4.0 standard drinks    Types: 4 Cans of beer per week    Comment: occassionally   .  Drug use: Not on file  . Sexual activity: Yes    Comment: one partner, male - 6 months  Lifestyle  . Physical activity    Days per week: 2 days    Minutes per session: 30 min  . Stress: Not at all  Relationships  . Social connections    Talks on phone: More than three times a week    Gets together: Three times a week    Attends religious service: More than 4 times per year    Active member of club or organization: No    Attends meetings of clubs or organizations: Never    Relationship status: Separated  Other Topics Concern  . Not on file  Social History Narrative   Plays softball - not since COVID         Social History   Socioeconomic History  . Marital status: Legally Separated    Spouse name: Not on file  . Number of children: 1  . Years of education: 64  . Highest education level: High school graduate  Occupational History  . Not on file  Social Needs  . Financial resource strain: Not hard at all  . Food insecurity    Worry: Never true    Inability: Never true  . Transportation needs    Medical: Yes    Non-medical: Yes  Tobacco Use  . Smoking status: Current Every Day Smoker    Packs/day: 1.00    Years: 40.00    Pack years: 40.00    Types: Cigarettes  . Smokeless tobacco: Never Used  Substance and Sexual Activity  . Alcohol use: Yes    Alcohol/week: 4.0 standard drinks    Types: 4 Cans of beer per week    Comment: occassionally   . Drug use: Not on file  . Sexual activity: Yes    Comment: one partner, male - 6 months  Lifestyle  . Physical activity    Days per week: 2 days    Minutes per session: 30 min  . Stress: Not at all  Relationships  . Social connections    Talks on phone:  More than three times a week    Gets together: Three times a week    Attends religious service: More than 4 times per year    Active member of club or organization: No    Attends meetings of clubs or organizations: Never    Relationship status: Separated  . Intimate partner violence    Fear of current or ex partner: No    Emotionally abused: No    Physically abused: No    Forced sexual activity: No  Other Topics Concern  . Not on file  Social History Narrative   Plays softball - not since COVID    Family History        Family Status  Relation Name Status  . Mother  Alive  . Father  Alive  . Sister  Alive  . Brother  Alive  . Daughter  Alive  . MGM  Deceased  . MGF  Deceased  . PGM  Deceased  . PGF  Deceased  . Sister  Alive  . Brother  Alive        His family history is not on file.      History reviewed. No pertinent family history.  There are no active problems to display for this patient.   Past Surgical History:  Procedure Laterality Date  . ROTATOR CUFF REPAIR  Current Outpatient Medications:  .  oxyCODONE-acetaminophen (PERCOCET) 5-325 MG tablet, Take 1 tablet by mouth every 6 (six) hours as needed for severe pain. (Patient not taking: Reported on 07/19/2019), Disp: 20 tablet, Rfl: 0 .  traMADol (ULTRAM) 50 MG tablet, Take 1 tablet (50 mg total) by mouth every 6 (six) hours as needed. (Patient not taking: Reported on 07/19/2019), Disp: 8 tablet, Rfl: 0  No Known Allergies  Patient Care Team: Danelle Berry, PA-C as PCP - General (Family Medicine)  I personally reviewed active problem list, medication list, allergies, family history, social history, health maintenance, notes from last encounter, lab results, imaging with the patient/caregiver today.  Review of Systems  Constitutional: Negative.  Negative for activity change, appetite change, fatigue and unexpected weight change.  HENT: Negative.   Eyes: Negative.   Respiratory: Negative.  Negative for  shortness of breath.   Cardiovascular: Negative.  Negative for chest pain, palpitations and leg swelling.  Gastrointestinal: Negative.  Negative for abdominal pain and blood in stool.  Endocrine: Negative.   Genitourinary: Negative.  Negative for decreased urine volume, difficulty urinating, testicular pain and urgency.  Musculoskeletal: Positive for arthralgias (right shoulder post op pain).  Skin: Negative.  Negative for color change and pallor.  Allergic/Immunologic: Negative.   Neurological: Negative.  Negative for syncope, weakness, light-headedness and numbness.  Psychiatric/Behavioral: Negative.  Negative for confusion, dysphoric mood, self-injury and suicidal ideas. The patient is not nervous/anxious.   All other systems reviewed and are negative.         Objective:   Vitals:  Vitals:   07/19/19 1112  BP: 118/78  Pulse: 82  Resp: 14  Temp: (!) 97.1 F (36.2 C)  SpO2: 96%  Weight: 210 lb 4.8 oz (95.4 kg)  Height: 6\' 1"  (1.854 m)    Body mass index is 27.75 kg/m.  Physical Exam Vitals signs and nursing note reviewed.  Constitutional:      General: He is not in acute distress.    Appearance: Normal appearance. He is well-developed. He is not ill-appearing, toxic-appearing or diaphoretic.     Interventions: Face mask in place.  HENT:     Head: Normocephalic and atraumatic.     Jaw: No trismus.     Right Ear: External ear normal.     Left Ear: External ear normal.  Eyes:     General: Lids are normal. No scleral icterus.    Conjunctiva/sclera: Conjunctivae normal.     Pupils: Pupils are equal, round, and reactive to light.  Neck:     Musculoskeletal: Normal range of motion and neck supple.     Trachea: Trachea and phonation normal. No tracheal deviation.  Cardiovascular:     Rate and Rhythm: Normal rate and regular rhythm.     Pulses: Normal pulses.          Radial pulses are 2+ on the right side and 2+ on the left side.       Posterior tibial pulses are 2+ on  the right side and 2+ on the left side.     Heart sounds: Normal heart sounds. No murmur. No friction rub. No gallop.   Pulmonary:     Effort: Pulmonary effort is normal. No respiratory distress.     Breath sounds: Normal breath sounds. No stridor. No wheezing, rhonchi or rales.  Abdominal:     General: Bowel sounds are normal. There is no distension.     Palpations: Abdomen is soft.     Tenderness: There is no  abdominal tenderness. There is no guarding or rebound.  Musculoskeletal: Normal range of motion.     Right lower leg: No edema.     Left lower leg: No edema.  Skin:    General: Skin is warm and dry.     Capillary Refill: Capillary refill takes less than 2 seconds.     Coloration: Skin is not jaundiced.     Findings: No rash.     Nails: There is no clubbing.   Neurological:     Mental Status: He is alert.     Cranial Nerves: No dysarthria or facial asymmetry.     Motor: No tremor or abnormal muscle tone.     Gait: Gait normal.  Psychiatric:        Mood and Affect: Mood normal.        Speech: Speech normal.        Behavior: Behavior normal. Behavior is cooperative.      No results found for this or any previous visit (from the past 2160 hour(s)).  Diabetic Foot Exam: Diabetic Foot Exam - Simple   No data filed      PHQ2/9: Depression screen PHQ 2/9 07/19/2019  Decreased Interest 0  Down, Depressed, Hopeless 0  PHQ - 2 Score 0  Altered sleeping 0  Tired, decreased energy 0  Change in appetite 0  Feeling bad or failure about yourself  0  Trouble concentrating 0  Moving slowly or fidgety/restless 0  Suicidal thoughts 0  PHQ-9 Score 0  Difficult doing work/chores Not difficult at all    Fall Risk: Fall Risk  07/19/2019  Falls in the past year? 0  Number falls in past yr: 0  Injury with Fall? 0    Functional Status Survey: Is the patient deaf or have difficulty hearing?: No Does the patient have difficulty seeing, even when wearing glasses/contacts?: No  Does the patient have difficulty concentrating, remembering, or making decisions?: No Does the patient have difficulty walking or climbing stairs?: No Does the patient have difficulty dressing or bathing?: No Does the patient have difficulty doing errands alone such as visiting a doctor's office or shopping?: No   Assessment & Plan:    CPE completed today - pt new to establish care, no real health concerns or known medical problems  . Prostate cancer screening and PSA options (with potential risks and benefits of testing vs not testing) were discussed along with recent recs/guidelines, shared decision making and handout/information given to pt today - no PSA today for age and lack of sx  . USPSTF grade A and B recommendations reviewed with patient; age-appropriate recommendations, preventive care, screening tests, etc discussed and encouraged; healthy living encouraged; see AVS for patient education given to patient  . Discussed importance of 150 minutes of physical activity weekly, AHA exercise recommendations given to pt in AVS/handout  . Discussed importance of healthy diet:  eating lean meats and proteins, avoiding trans fats and saturated fats, avoid simple sugars and excessive carbs in diet, eat 6 servings of fruit/vegetables daily and drink plenty of water and avoid sweet beverages.   . Recommended pt to do annual eye exam and routine dental exams/cleanings  . Reviewed Health Maintenance: Health Maintenance  Topic Date Due  . HIV Screening  03/02/1981  . COLONOSCOPY  03/02/2016  . INFLUENZA VACCINE  10/14/2023 (Originally 05/14/2019)  . TETANUS/TDAP  10/13/2026    . Immunizations:  Pt refuses all immunizations  1. Adult general medical exam Done per above - CBC  with Differential/Platelet - Lipid panel - Hemoglobin A1c - TSH - COMPLETE METABOLIC PANEL WITH GFR  2. Screening for endocrine, metabolic and immunity disorder - CBC with Differential/Platelet - Lipid panel -  Hemoglobin A1c - TSH - COMPLETE METABOLIC PANEL WITH GFR  3. Screening for malignant neoplasm of colon No sx - Ambulatory referral to Gastroenterology  4. Encounter to establish care with new doctor Reviewed available med hx, social hx, family hx and recent labs and ER visits  5. Screening for deficiency anemia  - CBC with Differential/Platelet  6. Screening for HIV without presence of risk factors Recently separated from wife, living with new male partner, will do HIV screen but declines any other STD screening tests - HIV Antibody (routine testing w rflx)  7. Encounter for screening for lipid disorder - Lipid panel - COMPLETE METABOLIC PANEL WITH GFR  8. Elevated CK With ER visit, no labs or PCP f/up, will recheck. - CK (Creatine Kinase)   Danelle Berry, PA-C 07/19/19 12:17 PM  [facility]

## 2019-07-20 LAB — COMPLETE METABOLIC PANEL WITH GFR
AG Ratio: 1.9 (calc) (ref 1.0–2.5)
ALT: 31 U/L (ref 9–46)
AST: 20 U/L (ref 10–35)
Albumin: 4.6 g/dL (ref 3.6–5.1)
Alkaline phosphatase (APISO): 41 U/L (ref 35–144)
BUN: 12 mg/dL (ref 7–25)
CO2: 25 mmol/L (ref 20–32)
Calcium: 9.8 mg/dL (ref 8.6–10.3)
Chloride: 107 mmol/L (ref 98–110)
Creat: 1.06 mg/dL (ref 0.70–1.33)
GFR, Est African American: 92 mL/min/{1.73_m2} (ref >=60)
GFR, Est Non African American: 80 mL/min/{1.73_m2} (ref >=60)
Globulin: 2.4 g/dL (calc) (ref 1.9–3.7)
Glucose, Bld: 92 mg/dL (ref 65–99)
Potassium: 4.3 mmol/L (ref 3.5–5.3)
Sodium: 140 mmol/L (ref 135–146)
Total Bilirubin: 0.6 mg/dL (ref 0.2–1.2)
Total Protein: 7 g/dL (ref 6.1–8.1)

## 2019-07-20 LAB — CBC WITH DIFFERENTIAL/PLATELET
Absolute Monocytes: 517 cells/uL (ref 200–950)
Basophils Absolute: 41 cells/uL (ref 0–200)
Basophils Relative: 0.6 %
Eosinophils Absolute: 190 cells/uL (ref 15–500)
Eosinophils Relative: 2.8 %
HCT: 46.6 % (ref 38.5–50.0)
Hemoglobin: 15.9 g/dL (ref 13.2–17.1)
Lymphs Abs: 2033 cells/uL (ref 850–3900)
MCH: 33.1 pg — ABNORMAL HIGH (ref 27.0–33.0)
MCHC: 34.1 g/dL (ref 32.0–36.0)
MCV: 97.1 fL (ref 80.0–100.0)
MPV: 9.3 fL (ref 7.5–12.5)
Monocytes Relative: 7.6 %
Neutro Abs: 4019 cells/uL (ref 1500–7800)
Neutrophils Relative %: 59.1 %
Platelets: 253 10*3/uL (ref 140–400)
RBC: 4.8 10*6/uL (ref 4.20–5.80)
RDW: 12.7 % (ref 11.0–15.0)
Total Lymphocyte: 29.9 %
WBC: 6.8 10*3/uL (ref 3.8–10.8)

## 2019-07-20 LAB — LIPID PANEL
Cholesterol: 196 mg/dL (ref <200)
HDL: 47 mg/dL (ref >=40)
LDL Cholesterol (Calc): 122 mg/dL (calc) — ABNORMAL HIGH
Non-HDL Cholesterol (Calc): 149 mg/dL (calc) — ABNORMAL HIGH (ref <130)
Total CHOL/HDL Ratio: 4.2 (calc) (ref <5.0)
Triglycerides: 158 mg/dL — ABNORMAL HIGH (ref <150)

## 2019-07-20 LAB — HEMOGLOBIN A1C
Hgb A1c MFr Bld: 5.6 % of total Hgb (ref <5.7)
Mean Plasma Glucose: 114 (calc)
eAG (mmol/L): 6.3 (calc)

## 2019-07-20 LAB — TSH: TSH: 0.71 mIU/L (ref 0.40–4.50)

## 2019-07-20 LAB — HIV ANTIBODY (ROUTINE TESTING W REFLEX): HIV 1&2 Ab, 4th Generation: NONREACTIVE

## 2019-07-20 LAB — CK: Total CK: 265 U/L — ABNORMAL HIGH (ref 44–196)

## 2019-08-02 ENCOUNTER — Encounter: Payer: Self-pay | Admitting: *Deleted

## 2020-07-02 ENCOUNTER — Other Ambulatory Visit: Payer: Self-pay

## 2020-07-02 ENCOUNTER — Other Ambulatory Visit: Payer: Self-pay | Admitting: Critical Care Medicine

## 2020-07-02 DIAGNOSIS — Z20822 Contact with and (suspected) exposure to covid-19: Secondary | ICD-10-CM

## 2020-07-03 LAB — SARS-COV-2, NAA 2 DAY TAT

## 2020-07-03 LAB — NOVEL CORONAVIRUS, NAA: SARS-CoV-2, NAA: NOT DETECTED

## 2022-04-02 ENCOUNTER — Encounter: Payer: Self-pay | Admitting: Family Medicine

## 2022-04-02 ENCOUNTER — Ambulatory Visit: Payer: Self-pay | Admitting: Family Medicine

## 2022-04-02 VITALS — BP 104/62 | HR 82 | Temp 98.4°F | Resp 16 | Ht 73.0 in | Wt 219.4 lb

## 2022-04-02 DIAGNOSIS — M25511 Pain in right shoulder: Secondary | ICD-10-CM

## 2022-04-02 MED ORDER — TRAMADOL HCL 50 MG PO TABS: 50.0000 mg | ORAL_TABLET | Freq: Three times a day (TID) | ORAL | 0 refills | Status: AC | PRN

## 2022-04-02 MED ORDER — MELOXICAM 15 MG PO TABS: 15.0000 mg | ORAL_TABLET | Freq: Every day | ORAL | 2 refills | Status: AC

## 2022-04-02 NOTE — Progress Notes (Signed)
Patient ID: Dustin Meyers, male    DOB: 1965/11/08, 56 y.o.   MRN: [id number]  PCP: Danelle Berry, PA-C  Chief Complaint  Patient presents with   Shoulder Pain    Right shoulder radiates down the arm. Onset for a month pain is getting worst especially when grasping things. Pt states had surgery 2 years ago    Subjective:   Dustin Meyers is a 56 y.o. male, presents to clinic with CC of the following:  HPI  Right shoulder pain onset about a month ago Pain onset was gradual, began after doing a job where he had to lift heavy things over his head for a day  Severe pain, feels like a toothache but constant - seems to be all over - located to right anterior shoulder, also to below his shoulder blade and radiating down his right arm, worse with certain movements, like reaching down or tying shoes No swelling, redness, numbness, weakness Pain is keeping him up at night He has tried resting, OTC tylenol, no improvement in 1 month  He reports having good strength and ROM but just severe pain   Rotator cuff surgery on same shoulder about 2 years ago with [facility]  There are no problems to display for this patient.     Current Outpatient Medications:    traMADol (ULTRAM) 50 MG tablet, Take 1 tablet (50 mg total) by mouth every 6 (six) hours as needed. (Patient not taking: Reported on 07/19/2019), Disp: 8 tablet, Rfl: 0   No Known Allergies   Social History   Tobacco Use   Smoking status: Every Day    Packs/day: 1.00    Years: 40.00    Total pack years: 40.00    Types: Cigarettes   Smokeless tobacco: Never  Vaping Use   Vaping Use: Never used  Substance Use Topics   Alcohol use: Yes    Alcohol/week: 4.0 standard drinks of alcohol    Types: 4 Cans of beer per week    Comment: occassionally       Chart Review Today: I personally reviewed active problem list, medication list, allergies, family history, social history, health maintenance, notes from last encounter, lab  results, imaging with the patient/caregiver today.   Review of Systems  Constitutional: Negative.   HENT: Negative.    Eyes: Negative.   Respiratory: Negative.    Cardiovascular: Negative.   Gastrointestinal: Negative.   Endocrine: Negative.   Genitourinary: Negative.   Musculoskeletal: Negative.   Skin: Negative.   Allergic/Immunologic: Negative.   Neurological: Negative.   Hematological: Negative.   Psychiatric/Behavioral: Negative.    All other systems reviewed and are negative.      Objective:   Vitals:   04/02/22 1517  BP: 104/62  Pulse: 82  Resp: 16  Temp: 98.4 F (36.9 C)  TempSrc: Oral  SpO2: 97%  Weight: 219 lb 6.4 oz (99.5 kg)  Height: 6\' 1"  (1.854 m)    Body mass index is 28.95 kg/m.  Physical Exam Vitals and nursing note reviewed.  Constitutional:      Appearance: He is well-developed.  HENT:     Head: Normocephalic and atraumatic.     Nose: Nose normal.  Eyes:     General:        Right eye: No discharge.        Left eye: No discharge.     Conjunctiva/sclera: Conjunctivae normal.  Neck:     Trachea: No tracheal deviation.  Cardiovascular:  Rate and Rhythm: Normal rate and regular rhythm.  Pulmonary:     Effort: Pulmonary effort is normal. No respiratory distress.     Breath sounds: No stridor.  Musculoskeletal:        General: Normal range of motion.     Right shoulder: Tenderness present. No swelling, deformity, bony tenderness or crepitus. Normal range of motion. Normal pulse.     Comments: Right shoulder - generalized ttp to deltoid, to biceps groove, AC joint and to inferior to scapular spine +apley scratch test Neg empty can tests and drop test Some slight elevation of right shoulder when compared to left (increased muscle tension to upper right trap)  Skin:    General: Skin is warm and dry.     Findings: No rash.  Neurological:     Mental Status: He is alert.     Motor: No abnormal muscle tone.     Coordination: Coordination  normal.  Psychiatric:        Behavior: Behavior normal.      Results for orders placed or performed in visit on 07/02/20  Novel Coronavirus, NAA ([facility])   Specimen: Nasopharyngeal(NP) swabs in vial transport medium   Nasopharynge  Screenin  Result Value Ref Range   SARS-CoV-2, NAA Not Detected Not Detected  SARS-COV-2, NAA 2 DAY TAT   Nasopharynge  Screenin  Result Value Ref Range   SARS-CoV-2, NAA 2 DAY TAT Performed        Assessment & Plan:   1. Acute pain of right shoulder Advised pt to rest arm, ice as needed, take mobic daily and can also take tylenol prn [phone] QID PRN Do home exercises he previously learned from extensive shoulder PT Reach out to prior ortho specialists to see cost of consult Lack of insurance may limit access to care Tramadol and other controlled substances discussed - he asked for oxy - reviewed risk and prescribing laws, explained I can only give 3-5 days of meds for severe pain and I was being generous today because of his reported severity of pain and he has tried conservative measures for a month, plus past pathology and surgery to shoulder Explained that I will not give refills He will likely need to see specialists and PT to help rehab if not gradually improving Also warned that if he continues to use with strenuous work daily - he will likely continued to have pain/inflammation for longer amount of time - Ambulatory referral to Orthopedics - meloxicam (MOBIC) 15 MG tablet; Take 1 tablet (15 mg total) by mouth daily.  Dispense: 30 tablet; Refill: 2 - traMADol (ULTRAM) 50 MG tablet; Take 1 tablet (50 mg total) by mouth every 8 (eight) hours as needed for up to 5 days for severe pain.  Dispense: 15 tablet; Refill: 0    Pt verbalized understanding of plan F/up in 1 month if needed  Danelle Berry, PA-C 04/02/22 3:31 PM

## 2023-06-01 ENCOUNTER — Ambulatory Visit: Payer: Self-pay
